# Patient Record
Sex: Female | Born: 1955 | Race: White | Hispanic: No | Marital: Married | Smoking: Never smoker
Health system: Southern US, Community
[De-identification: ages and names within clinical notes are randomized; demographics above are authoritative.]

## PROBLEM LIST (undated history)

## (undated) DIAGNOSIS — M1811 Unilateral primary osteoarthritis of first carpometacarpal joint, right hand: Secondary | ICD-10-CM

## (undated) DIAGNOSIS — J45909 Unspecified asthma, uncomplicated: Secondary | ICD-10-CM

## (undated) DIAGNOSIS — F419 Anxiety disorder, unspecified: Secondary | ICD-10-CM

## (undated) DIAGNOSIS — Z86010 Personal history of colonic polyps: Secondary | ICD-10-CM

## (undated) DIAGNOSIS — F32A Depression, unspecified: Secondary | ICD-10-CM

## (undated) DIAGNOSIS — M1990 Unspecified osteoarthritis, unspecified site: Secondary | ICD-10-CM

## (undated) DIAGNOSIS — I7300 Raynaud's syndrome without gangrene: Secondary | ICD-10-CM

## (undated) DIAGNOSIS — S5291XA Unspecified fracture of right forearm, initial encounter for closed fracture: Secondary | ICD-10-CM

## (undated) DIAGNOSIS — M810 Age-related osteoporosis without current pathological fracture: Secondary | ICD-10-CM

## (undated) HISTORY — DX: Age-related osteoporosis without current pathological fracture: M81.0

## (undated) HISTORY — DX: Unspecified asthma, uncomplicated: J45.909

## (undated) HISTORY — PX: WRIST FRACTURE SURGERY: SHX121

## (undated) HISTORY — DX: Unspecified osteoarthritis, unspecified site: M19.90

## (undated) HISTORY — PX: BLADDER SURGERY: SHX569

## (undated) HISTORY — PX: BLADDER SUSPENSION: SHX72

## (undated) HISTORY — DX: Personal history of colonic polyps: Z86.010

---

## 1978-06-10 HISTORY — PX: TUBAL LIGATION: SHX77

## 1983-06-11 HISTORY — PX: EYE SURGERY: SHX253

## 1997-11-28 ENCOUNTER — Inpatient Hospital Stay (HOSPITAL_COMMUNITY): Admission: RE | Admit: 1997-11-28 | Discharge: 1997-11-30 | Payer: Self-pay | Admitting: Gynecology

## 1998-03-13 ENCOUNTER — Other Ambulatory Visit: Admission: RE | Admit: 1998-03-13 | Discharge: 1998-03-13 | Payer: Self-pay | Admitting: Gynecology

## 2000-01-30 ENCOUNTER — Other Ambulatory Visit: Admission: RE | Admit: 2000-01-30 | Discharge: 2000-01-30 | Payer: Self-pay | Admitting: Gynecology

## 2001-02-10 ENCOUNTER — Other Ambulatory Visit: Admission: RE | Admit: 2001-02-10 | Discharge: 2001-02-10 | Payer: Self-pay | Admitting: Gynecology

## 2002-01-08 ENCOUNTER — Other Ambulatory Visit: Admission: RE | Admit: 2002-01-08 | Discharge: 2002-01-08 | Payer: Self-pay | Admitting: Gynecology

## 2003-02-03 ENCOUNTER — Other Ambulatory Visit: Admission: RE | Admit: 2003-02-03 | Discharge: 2003-02-03 | Payer: Self-pay | Admitting: Gynecology

## 2003-11-01 ENCOUNTER — Ambulatory Visit (HOSPITAL_COMMUNITY): Admission: RE | Admit: 2003-11-01 | Discharge: 2003-11-01 | Payer: Self-pay | Admitting: Vascular Surgery

## 2004-04-09 ENCOUNTER — Other Ambulatory Visit: Admission: RE | Admit: 2004-04-09 | Discharge: 2004-04-09 | Payer: Self-pay | Admitting: Gynecology

## 2005-04-12 ENCOUNTER — Other Ambulatory Visit: Admission: RE | Admit: 2005-04-12 | Discharge: 2005-04-12 | Payer: Self-pay | Admitting: Gynecology

## 2006-04-14 ENCOUNTER — Other Ambulatory Visit: Admission: RE | Admit: 2006-04-14 | Discharge: 2006-04-14 | Payer: Self-pay | Admitting: Gynecology

## 2006-06-10 DIAGNOSIS — Z860100 Personal history of colon polyps, unspecified: Secondary | ICD-10-CM

## 2006-06-10 HISTORY — DX: Personal history of colon polyps, unspecified: Z86.0100

## 2006-06-10 HISTORY — DX: Personal history of colonic polyps: Z86.010

## 2007-05-04 ENCOUNTER — Other Ambulatory Visit: Admission: RE | Admit: 2007-05-04 | Discharge: 2007-05-04 | Payer: Self-pay | Admitting: Gynecology

## 2008-05-09 ENCOUNTER — Encounter: Payer: Self-pay | Admitting: Women's Health

## 2008-05-09 ENCOUNTER — Other Ambulatory Visit: Admission: RE | Admit: 2008-05-09 | Discharge: 2008-05-09 | Payer: Self-pay | Admitting: Gynecology

## 2008-05-09 ENCOUNTER — Ambulatory Visit: Payer: Self-pay | Admitting: Women's Health

## 2008-05-10 ENCOUNTER — Ambulatory Visit: Payer: Self-pay | Admitting: Women's Health

## 2008-06-13 ENCOUNTER — Ambulatory Visit: Payer: Self-pay | Admitting: Women's Health

## 2008-07-07 ENCOUNTER — Ambulatory Visit: Payer: Self-pay | Admitting: Gynecology

## 2008-07-18 ENCOUNTER — Ambulatory Visit: Payer: Self-pay | Admitting: Gynecology

## 2008-08-02 ENCOUNTER — Ambulatory Visit: Payer: Self-pay | Admitting: Gynecology

## 2008-08-04 ENCOUNTER — Ambulatory Visit: Payer: Self-pay | Admitting: Gynecology

## 2008-08-04 ENCOUNTER — Encounter: Payer: Self-pay | Admitting: Gynecology

## 2009-05-12 ENCOUNTER — Other Ambulatory Visit: Admission: RE | Admit: 2009-05-12 | Discharge: 2009-05-12 | Payer: Self-pay | Admitting: Gynecology

## 2009-05-12 ENCOUNTER — Ambulatory Visit: Payer: Self-pay | Admitting: Women's Health

## 2010-06-20 ENCOUNTER — Ambulatory Visit
Admission: RE | Admit: 2010-06-20 | Discharge: 2010-06-20 | Payer: Self-pay | Source: Home / Self Care | Attending: Women's Health | Admitting: Women's Health

## 2010-06-20 ENCOUNTER — Other Ambulatory Visit
Admission: RE | Admit: 2010-06-20 | Discharge: 2010-06-20 | Payer: Self-pay | Source: Home / Self Care | Admitting: Gynecology

## 2010-07-17 ENCOUNTER — Encounter (INDEPENDENT_AMBULATORY_CARE_PROVIDER_SITE_OTHER): Payer: Private Health Insurance - Indemnity

## 2010-07-17 DIAGNOSIS — M949 Disorder of cartilage, unspecified: Secondary | ICD-10-CM

## 2010-07-17 DIAGNOSIS — M899 Disorder of bone, unspecified: Secondary | ICD-10-CM

## 2010-10-26 NOTE — Op Note (Signed)
NAMESKYLENE, DEREMER                          ACCOUNT NO.:  [id number]   MEDICAL RECORD NO.:  [id number]                   PATIENT TYPE:  OIB   LOCATION:  2899                                 FACILITY:  [facility]   PHYSICIAN:  Janetta Hora. Fields, MD               DATE OF BIRTH:  1955/06/17   DATE OF PROCEDURE:  11/01/2003  DATE OF DISCHARGE:                                 OPERATIVE REPORT   PROCEDURE:  Arch aortogram with left upper extremity angiogram and third  order catheterization of the brachial artery.   PREOPERATIVE DIAGNOSIS:  Ischemia, left hand.   POSTOPERATIVE DIAGNOSIS:  Ischemia, left hand.   ANESTHESIA:  Local with IV sedation.   ASSISTANT:  Nurse.   INDICATIONS FOR PROCEDURE:  The patient is a 55 year old female who has had  a two week history of numbness, tingling, and discoloration of her second  and third digits on her left hand.   OPERATIVE PROCEDURE:  After obtaining informed consent, the patient was  brought to the angiogram suite.  The patient was placed in the supine  position on the angiogram table.  The right groin was prepped and draped in  the usual sterile fashion.  Local anesthesia was infiltrated over the right  common femoral artery.  A Majestic needle was used to cannulate the right  common femoral artery and a 0.035 Wooley wire threaded into the lower  abdominal aorta under fluoroscopic guidance.  A 5 French sheath was placed  over the guide-wire.  Next, a 5 French pigtail catheter was threaded over  the [facility] wire and, as a unit, these were advanced into the aortic arch.  An arch aortogram was then performed which shows normal take off of the  innominate artery with normal right subclavian and right common carotid  origins.  The right vertebral artery fills normally as well as the right  internal mammary artery.  The left common carotid artery comes off normal  position off the arch.  The left subclavian artery comes off normal position  off the  arch.  Both of these vessels were widely patent in their origin.  The left vertebral artery was widely patent without significant stenosis.  The left internal mammary artery comes off normal position off the  subclavian artery.  The left subclavian system is slightly smaller in size  than the right subclavian system.  Next, images were performed of the  proximal subclavian artery.  This is normal in appearance without any  irregularity, ulceration, or plaque.  Proceeding down the arm, the axillary  artery is normal in appearance with no ulceration or plaque.  The proximal  brachial artery is also normal in appearance.  The brachial bifurcation is  normal in appearance with normal origins of the interosseous, radial, and  ulnar arteries.  To increase the flow of contrast down the arm, the left  subclavian artery was selectively cannulated with the  Wooley wire using an  H1 catheter.  The H1 catheter was initially advanced into the proximal  subclavian artery and there was still inadequate contrast flow down to the  hand, so the Wooley wire was removed and a J-tip 0.035 guide-wire advanced  into the distal subclavian artery and the catheter advanced over this and  the catheter and wire advanced into the distal brachial artery.  The  superficial and deep palmar arches fill completely.  The radial and ulnar  arteries are normal throughout their course all the way to the hand.  The  proximal digital arteries of all five digits fill normally, however, there  is severe spasm from the proximal interphalangeal joint all the way out to  the distal digits.  Several attempts were made to release the spasm  including two doses of 200 mg nitroglycerin and then two doses of 30 mg of  papaverine.  These were both injected intra-arterially through the H1  catheter.  Despite these several different vasodilator drugs, the spasm was  still quite severe from the PIP joint distally.  The first digit did fill  all  the way out to the distal phalanx, however, the PIP joint was the only  level that filled through digits 2 through 4.  The patient was also noted to  have some flushing and hyperemia in the left arm during administration of  these vasodilator drugs, but she never had a hyperemic response in her hand.  At this point, the patient had been given a significant amount of contrast  and her blood pressure would no longer tolerate administration of anymore  vasoactive drugs.  The patient was briefly placed in Trendelenburg position  and given a fluid bolus to bring her blood pressure from the mid 70s back up  into the low 100\s which is her baseline.  Next, the guide-wire and H1  catheter were removed as a unit.  The 5 French sheath was removed and  hemostasis obtained with direct pressure.  The patient tolerated the  procedure well and there were no complications.   IMPRESSION:  1. Severe vasospasm of distal digital arteries left hand.  2. Normal subclavian, axillary, brachial, radial, interosseous, ulnar and     palmar arch vessels.                                               Janetta Hora. Fields, MD    CEF/MEDQ  D:  11/01/2003  T:  11/01/2003  Job:  [id number]

## 2011-05-28 ENCOUNTER — Encounter: Payer: Self-pay | Admitting: Women's Health

## 2011-06-22 ENCOUNTER — Other Ambulatory Visit: Payer: Self-pay | Admitting: Women's Health

## 2011-11-18 ENCOUNTER — Encounter: Payer: Self-pay | Admitting: Women's Health

## 2011-11-18 ENCOUNTER — Ambulatory Visit (INDEPENDENT_AMBULATORY_CARE_PROVIDER_SITE_OTHER): Payer: 59 | Admitting: Women's Health

## 2011-11-18 VITALS — BP 114/70 | Ht 63.0 in | Wt 161.0 lb

## 2011-11-18 DIAGNOSIS — M1990 Unspecified osteoarthritis, unspecified site: Secondary | ICD-10-CM | POA: Insufficient documentation

## 2011-11-18 DIAGNOSIS — Z13220 Encounter for screening for lipoid disorders: Secondary | ICD-10-CM

## 2011-11-18 DIAGNOSIS — Z01419 Encounter for gynecological examination (general) (routine) without abnormal findings: Secondary | ICD-10-CM

## 2011-11-18 DIAGNOSIS — Z833 Family history of diabetes mellitus: Secondary | ICD-10-CM

## 2011-11-18 LAB — CBC WITH DIFFERENTIAL/PLATELET
Eosinophils Absolute: 0.7 10*3/uL (ref 0.0–0.7)
HCT: 43.3 % (ref 36.0–46.0)
Hemoglobin: 13.6 g/dL (ref 12.0–15.0)
Lymphocytes Relative: 39 % (ref 12–46)
Monocytes Absolute: 0.6 10*3/uL (ref 0.1–1.0)
Monocytes Relative: 9 % (ref 3–12)
Platelets: 315 10*3/uL (ref 150–400)
RBC: 4.4 MIL/uL (ref 3.87–5.11)
WBC: 7.3 10*3/uL (ref 4.0–10.5)

## 2011-11-18 LAB — LIPID PANEL
Cholesterol: 293 mg/dL — ABNORMAL HIGH (ref 0–200)
Total CHOL/HDL Ratio: 5 Ratio

## 2011-11-18 NOTE — Progress Notes (Signed)
JANANI CHAMBER [birth year] [id number]    History:    The patient presents for annual exam.  Postmenopausal, no  HRT, no bleeding. Mammogram Jan. 2013, normal mammogram history. Last colonoscopy 2008-benign polyps. History of all normal Paps. Bone density 2012, osteopenia, T-score  -2.2 at femoral neck. History of a bladder neck suspension-1993 with good relief of symptoms.   Past medical history, past surgical history, family history and social history were all reviewed and documented in the EPIC chart. Mother hx of uterine CA, age 11. Retired from [employer]. History of anxiety/depression denies need for counseling or medication.  ROS:  A  ROS was performed and pertinent positives and negatives are included in the history.  Exam:  Filed Vitals:   11/18/11 1045  BP: 114/70    General appearance:  Normal Head/Neck:  Normal, without cervical or supraclavicular adenopathy. Thyroid:  Symmetrical, normal in size, without palpable masses or nodularity. Respiratory  Effort:  Normal  Auscultation:  Clear without wheezing or rhonchi Cardiovascular  Auscultation:  Regular rate, without rubs, murmurs or gallops  Edema/varicosities:  Not grossly evident Abdominal  Soft,nontender, without masses, guarding or rebound.  Liver/spleen:  No organomegaly noted  Hernia:  None appreciated  Skin  Inspection:  Grossly normal  Palpation:  Grossly normal Neurologic/psychiatric  Orientation:  Normal with appropriate conversation.  Mood/affect:  Normal  Genitourinary    Breasts: Examined lying and sitting.     Right: Without masses, retractions, discharge or axillary adenopathy.     Left: Without masses, retractions, discharge or axillary adenopathy.   Inguinal/mons:  Normal without inguinal adenopathy  External genitalia:  Normal  BUS/Urethra/Skene's glands:  Normal  Bladder:  Normal  Vagina:  Normal  Cervix:  Normal  Uterus:  Normal in size, shape and contour.  Midline and  mobile  Adnexa/parametria:     Rt: Without masses or tenderness.   Lt: Without masses or tenderness.  Anus and perineum: Normal  Digital rectal exam: Normal sphincter tone without palpated masses or tenderness  Assessment/Plan:  56 y.o. MWF G3P2 for annual exam.  Normal postmenopausal gyn exam/ no HRT/no bleeding Osteopenia 2012- T-score -2.2 at femoral neck. 2012 Colonoscopy 2008-benign polyps  Plan: CBC, Lipid panel, glucose, UA  Instructed to schedule colonoscopy. SBE's, vitamin D 2000 IU daily, Ca rich diet, daily exercise, yearly mammograms encouraged. Fall precautions reviewed. No pap, new screening recommendations reviewed.    Harrington Challenger [facility], 11:41 AM 11/18/2011

## 2011-11-18 NOTE — Patient Instructions (Signed)
Schedule colonoscopy Health Recommendations for Postmenopausal Women Based on the Results of the Women's Health Initiative (WHI) and Other Studies The WHI is a major 15-year research program to address the most common causes of death, disability and poor quality of life in postmenopausal women. Some of these causes are heart disease, cancer, bone loss (osteoporosis) and others. Taking into account all of the findings from WHI and other studies, here are bottom-line health recommendations for women: CARDIOVASCULAR DISEASE Heart Disease: A heart attack is a medical emergency. Know the signs and symptoms of a heart attack. Hormone therapy should not be used to prevent heart disease. In women with heart disease, hormone therapy should not be used to prevent further disease. Hormone therapy increases the risk of blood clots. Below are things women can do to reduce their risk for heart disease.   Do not smoke. If you smoke, quit. Women who smoke are 2 to 6 times more likely to suffer a heart attack than non-smoking women.   Aim for a healthy weight. Being overweight causes many preventable deaths. Eat a healthy and balanced diet and drink an adequate amount of liquids.   Get moving. Make a commitment to be more physically active. Aim for 30 minutes of activity on most, if not all days of the week.   Eat for heart health. Choose a diet that is low in saturated fat, trans fat, and cholesterol. Include whole grains, vegetables, and fruits. Read the labels on the food container before buying it.   Know your numbers. Ask your caregiver to check your blood pressure, cholesterol (total, HDL, LDL, triglycerides) and blood glucose. Work with your caregiver to improve any numbers that are not normal.   High blood pressure. Limit or stop your table salt intake (try salt substitute and food seasonings), avoid salty foods and drinks. Read the labels on the food container before buying it. Avoid becoming overweight by  eating well and exercising.  STROKE  Stroke is a medical emergency. Stroke can be the result of a blood clot in the blood vessel in the brain or by a brain hemorrhage (bleeding). Know the signs and symptoms of a stroke. To lower the risk of developing a stroke:  Avoid fatty foods.   Quit smoking.   Control your diabetes, blood pressure, and irregular heart rate.  THROMBOPHLIBITIS (BLOOD CLOT) OF THE LEG  Hormone treatment is a big cause of developing blood clots in the leg. Becoming overweight and leading a stationary lifestyle also may contribute to developing blood clots. Controlling your diet and exercising will help lower the risk of developing blood clots. CANCER SCREENING  Breast Cancer: Women should take steps to reduce their risk of breast cancer. This includes having regular mammograms, monthly self breast exams and regular breast exams by your caregiver. Have a mammogram every one to two years if you are 40 to 57 years old. Have a mammogram annually if you are 50 years old or older depending on your risk factors. Women who are high risk for breast cancer may need more frequent mammograms. There are tests available (testing the genes in your body) if you have family history of breast cancer called BRCA 1 and 2. These tests can help determine the risks of developing breast cancer.   Intestinal or Stomach Cancer: Women should talk to their caregiver about when to start screening, what tests and how often they should be done, and the benefits and risks of doing these tests. Tests to consider are a rectal   exam, fecal occult blood, sigmoidoscopy, colononoscoby, barium enema and upper GI series of the stomach. Depending on the age, you may want to get a medical and family history of colon cancer. Women who are high risk may need to be screened at an earlier age and more often.   Cervical Cancer: A Pap test of the cervix should be done every year and every 3 years when there has been three  straight years of a normal Pap test. Women with an abnormal Pap test should be screened more often or have a cervical biopsy depending on your caregiver's recommendation.   Uterine Cancer: If you have vaginal bleeding after you are in the menopause, it should be evaluated by your caregiver.   Ovarian cancer: There are no reliable tests available to screen for ovarian cancer at this time except for yearly pelvic exams.   Lung Cancer: Yearly chest X-rays can detect lung cancer and should be done on high risk women, such as cigarette smokers and women with chronic lung disease (emphysemia).   Skin Cancer: A complete body skin exam should be done at your yearly examination. Avoid overexposure to the sun and ultraviolet light lamps. Use a strong sun block cream when in the sun. All of these things are important in lowering the risk of skin cancer.  MENOPAUSE Menopause Symptoms: Hormone therapy products are effective for treating symptoms associated with menopause:  Moderate to severe hot flashes.   Night sweats.   Mood swings.   Headaches.   Tiredness.   Loss of sex drive.   Insomnia.   Other symptoms.  However, hormone therapy products carry serious risks, especially in older women. Women who use or are thinking about using estrogen or estrogen with progestin treatments should discuss that with their caregiver. Your caregiver will know if the benefits outweigh the risks. The Food and Drug Administration (FDA) has concluded that hormone therapy should be used only at the lowest doses and for the shortest amount of time to reach treatment goals. It is not known at what doses there may be less risk of serious side effects. There are other treatments such as herbal medication (not controlled or regulated by the FDA), group therapy, counseling and acupuncture that may be helpful. OSTEOPOROSIS Protecting Against Bone Loss and Preventing Fracture: If hormone therapy is used for prevention of bone  loss (osteoporosis), the risks for bone loss must outweigh the risk of the therapy. Women considering taking hormone therapy for bone loss should ask their health care providers about other medications (fosamax and boniva) that are considered safe and effective for preventing bone loss and bone fractures. To guard against bone loss or fractures, it is recommended that women should take at least 1000-1500 mg of calcium and 400-800 IU of vitamin D daily in divided doses. Smoking and excessive alcohol intake increases the risk of osteoporosis. Eat foods rich in calcium and vitamin D and do weight bearing exercises several times a week as your caregiver suggests. DIABETES Diabetes Melitus: Women with Type I or Type 2 diabetes should keep their diabetes in control with diet, exercise and medication. Avoid too many sweets, starchy and fatty foods. Being overweight can affect your diabetes. COGNITION AND MEMORY Cognition and Memory: Menopausal hormone therapy is not recommended for the prevention of cognitive disorders such as Alzheimer's disease or memory loss. WHI found that women treated with hormone therapy have a greater risk of developing dementia.  DEPRESSION  Depression may occur at any age, but is common in   elderly women. The reasons may be because of physical, medical, social (loneliness), financial and/or economic problems and needs. Becoming involved with church, volunteer or social groups, seeking treatment for any physical or medical problems is recommended. Also, look into getting professional advice for any economic or financial problems. ACCIDENTS  Accidents are common and can be serious in the elderly woman. Prepare your house to prevent accidents. Eliminate throw rugs, use hip protectors, place hand bars in the bath, shower and toilet areas. Avoid wearing high heel shoes and walking on wet, snowy and icy areas. Stop driving if you have vision, hearing problems or are unsteady with you movements  and reflexes. RHEUMATOID ARTHRITIS Rheumatoid arthritis causes pain, swelling and stiffness of your bone joints. It can limit many of your activities. Over-the-counter medications may help, but prescription medications may be necessary. Talk with your caregiver about this. Exercise (walking, water aerobics), good posture, using splints on painful joints, warm baths or applying warm compresses to stiff joints and cold compresses to painful joints may be helpful. Smoking and excessive drinking may worsen the symptoms of arthritis. Seek help from a physical therapist if the arthritis is becoming a problem with your daily activities. IMMUNIZATIONS  Several immunizations are important to have during your senior years, including:   Tetanus and a diptheria shot booster every 10 years.   Influenza every year before the flu season begins.   Pneumonia vaccine.   Shingles vaccine.   Others as indicated (example: H1N1 vaccine).  Document Released: 07/19/2005 Document Revised: 05/16/2011 Document Reviewed: 03/14/2008 ExitCare Patient Information 2012 [employer]. 

## 2011-11-19 LAB — URINALYSIS W MICROSCOPIC + REFLEX CULTURE
Bacteria, UA: NONE SEEN
Bilirubin Urine: NEGATIVE
Casts: NONE SEEN
Glucose, UA: NEGATIVE mg/dL
Hgb urine dipstick: NEGATIVE
Ketones, ur: NEGATIVE mg/dL
Protein, ur: NEGATIVE mg/dL
Urobilinogen, UA: 0.2 mg/dL (ref 0.0–1.0)

## 2012-01-06 ENCOUNTER — Telehealth: Payer: Self-pay | Admitting: *Deleted

## 2012-01-06 MED ORDER — FLUOXETINE HCL 40 MG PO CAPS: 40.0000 mg | ORAL_CAPSULE | Freq: Every day | ORAL | Status: DC

## 2012-01-06 NOTE — Telephone Encounter (Signed)
Okay for 90 day supply per nancy. rx sent.

## 2012-01-06 NOTE — Telephone Encounter (Signed)
Pt calling requesting Rx for her Prozac 40 mg tablet, she is requesting 90 day supply to [facility]. Okay to fill? Giving back in Jan 2012.  Please advise

## 2012-05-26 ENCOUNTER — Encounter: Payer: Self-pay | Admitting: Women's Health

## 2012-06-16 ENCOUNTER — Other Ambulatory Visit: Payer: Self-pay | Admitting: Women's Health

## 2012-10-07 ENCOUNTER — Telehealth: Payer: Self-pay | Admitting: *Deleted

## 2012-10-07 ENCOUNTER — Ambulatory Visit (INDEPENDENT_AMBULATORY_CARE_PROVIDER_SITE_OTHER): Payer: Managed Care, Other (non HMO) | Admitting: Women's Health

## 2012-10-07 ENCOUNTER — Encounter: Payer: Self-pay | Admitting: Women's Health

## 2012-10-07 VITALS — Wt 136.0 lb

## 2012-10-07 DIAGNOSIS — B373 Candidiasis of vulva and vagina: Secondary | ICD-10-CM

## 2012-10-07 DIAGNOSIS — N898 Other specified noninflammatory disorders of vagina: Secondary | ICD-10-CM

## 2012-10-07 DIAGNOSIS — N899 Noninflammatory disorder of vagina, unspecified: Secondary | ICD-10-CM

## 2012-10-07 LAB — WET PREP FOR TRICH, YEAST, CLUE
Clue Cells Wet Prep HPF POC: NONE SEEN
Yeast Wet Prep HPF POC: NONE SEEN

## 2012-10-07 MED ORDER — FLUCONAZOLE 150 MG PO TABS: 150.0000 mg | ORAL_TABLET | Freq: Once | ORAL | Status: DC

## 2012-10-07 NOTE — Telephone Encounter (Signed)
Diflucan 150 was not sent to correct pharmacy, rx sent to mail order. Pt would like rx sent to [facility] [address].

## 2012-10-07 NOTE — Addendum Note (Signed)
Addended by: Richardson Chiquito on: 10/07/2012 02:29 PM   Modules accepted: Orders

## 2012-10-07 NOTE — Patient Instructions (Addendum)
Monilial Vaginitis Vaginitis in a soreness, swelling and redness (inflammation) of the vagina and vulva. Monilial vaginitis is not a sexually transmitted infection. CAUSES  Yeast vaginitis is caused by yeast (candida) that is normally found in your vagina. With a yeast infection, the candida has overgrown in number to a point that upsets the chemical balance. SYMPTOMS   White, thick vaginal discharge.  Swelling, itching, redness and irritation of the vagina and possibly the lips of the vagina (vulva).  Burning or painful urination.  Painful intercourse. DIAGNOSIS  Things that may contribute to monilial vaginitis are:  Postmenopausal and virginal states.  Pregnancy.  Infections.  Being tired, sick or stressed, especially if you had monilial vaginitis in the past.  Diabetes. Good control will help lower the chance.  Birth control pills.  Tight fitting garments.  Using bubble bath, feminine sprays, douches or deodorant tampons.  Taking certain medications that kill germs (antibiotics).  Sporadic recurrence can occur if you become ill. TREATMENT  Your caregiver will give you medication.  There are several kinds of anti monilial vaginal creams and suppositories specific for monilial vaginitis. For recurrent yeast infections, use a suppository or cream in the vagina 2 times a week, or as directed.  Anti-monilial or steroid cream for the itching or irritation of the vulva may also be used. Get your caregiver's permission.  Painting the vagina with methylene blue solution may help if the monilial cream does not work.  Eating yogurt may help prevent monilial vaginitis. HOME CARE INSTRUCTIONS   Finish all medication as prescribed.  Do not have sex until treatment is completed or after your caregiver tells you it is okay.  Take warm sitz baths.  Do not douche.  Do not use tampons, especially scented ones.  Wear cotton underwear.  Avoid tight pants and panty  hose.  Tell your sexual partner that you have a yeast infection. They should go to their caregiver if they have symptoms such as mild rash or itching.  Your sexual partner should be treated as well if your infection is difficult to eliminate.  Practice safer sex. Use condoms.  Some vaginal medications cause latex condoms to fail. Vaginal medications that harm condoms are:  Cleocin cream.  Butoconazole (Femstat).  Terconazole (Terazol) vaginal suppository.  Miconazole (Monistat) (may be purchased over the counter). SEEK MEDICAL CARE IF:   You have a temperature by mouth above 102 F (38.9 C).  The infection is getting worse after 2 days of treatment.  The infection is not getting better after 3 days of treatment.  You develop blisters in or around your vagina.  You develop vaginal bleeding, and it is not your menstrual period.  You have pain when you urinate.  You develop intestinal problems.  You have pain with sexual intercourse. Document Released: 03/06/2005 Document Revised: 08/19/2011 Document Reviewed: 11/18/2008 ExitCare Patient Information 2013 [employer].  

## 2012-10-07 NOTE — Progress Notes (Signed)
Patient ID: Erika Davis, female   DOB: [birth year], 57 y.o.   MRN: [id number] Presents with complaint of vaginal irritation with dyspareunia for 2 months. Postmenopausal with no bleeding/no HRT. Symptoms started after doing hot yoga for several sessions. Denies discharge with odor, urinary symptoms, abdominal pain, fever. Has lost 25 pounds with [employer].  Exam: Appears well, external genitalia extremely erythematous, wet prep done with a Q-tip. Wet prep negative.  Clinical Yeast vaginitis  Plan: Diflucan 150 mg  today repeat in 3 days if needed. Call if no relief of symptoms. Continue vaginal lubricants as needed with intercourse.

## 2012-10-16 ENCOUNTER — Encounter: Payer: Self-pay | Admitting: Women's Health

## 2012-10-16 ENCOUNTER — Ambulatory Visit (INDEPENDENT_AMBULATORY_CARE_PROVIDER_SITE_OTHER): Payer: Managed Care, Other (non HMO) | Admitting: Women's Health

## 2012-10-16 DIAGNOSIS — N898 Other specified noninflammatory disorders of vagina: Secondary | ICD-10-CM

## 2012-10-16 DIAGNOSIS — L293 Anogenital pruritus, unspecified: Secondary | ICD-10-CM

## 2012-10-16 LAB — WET PREP FOR TRICH, YEAST, CLUE
Clue Cells Wet Prep HPF POC: NONE SEEN
Trich, Wet Prep: NONE SEEN
Yeast Wet Prep HPF POC: NONE SEEN

## 2012-10-16 MED ORDER — NYSTATIN-TRIAMCINOLONE 100000-0.1 UNIT/GM-% EX OINT: TOPICAL_OINTMENT | Freq: Two times a day (BID) | CUTANEOUS | Status: DC

## 2012-10-16 NOTE — Progress Notes (Signed)
Patient ID: Erika Davis, female   DOB: [birth year], 57 y.o.   MRN: [id number] Presents for follow up for vaginal erythema with itching/clinical yeast infection on 10/07/12 after hot yoga classes. Has taken 2 doses of Diflucan 3 days apart, 75% better  but symptoms persist. Swelling and itching decreased but still tender and itchy.Denies discharge with odor, urinary symptoms, abdominal pain, fever.   Exam: appears well. External genitalia minimal erythema noted, improved, Wet prep-negative.  Improved yeast vaginitis  Plan: Mycolog apply topically two times daily. Instructed to continue vaginal lubricant with intercourse and call if symptoms don't resolve.

## 2012-11-18 ENCOUNTER — Other Ambulatory Visit (HOSPITAL_COMMUNITY)
Admission: RE | Admit: 2012-11-18 | Discharge: 2012-11-18 | Disposition: A | Discharge status: Home / Self Care | Payer: 59 | Source: Ambulatory Visit | Attending: Gynecology | Admitting: Gynecology

## 2012-11-18 ENCOUNTER — Encounter: Payer: Self-pay | Admitting: Women's Health

## 2012-11-18 ENCOUNTER — Ambulatory Visit (INDEPENDENT_AMBULATORY_CARE_PROVIDER_SITE_OTHER): Payer: Managed Care, Other (non HMO) | Admitting: Women's Health

## 2012-11-18 VITALS — BP 114/74 | Ht 63.0 in | Wt 132.0 lb

## 2012-11-18 DIAGNOSIS — Z23 Encounter for immunization: Secondary | ICD-10-CM

## 2012-11-18 DIAGNOSIS — Z01419 Encounter for gynecological examination (general) (routine) without abnormal findings: Secondary | ICD-10-CM

## 2012-11-18 DIAGNOSIS — N898 Other specified noninflammatory disorders of vagina: Secondary | ICD-10-CM

## 2012-11-18 DIAGNOSIS — N9489 Other specified conditions associated with female genital organs and menstrual cycle: Secondary | ICD-10-CM

## 2012-11-18 MED ORDER — ESTRADIOL 10 MCG VA TABS: 1.0000 | ORAL_TABLET | VAGINAL | Status: DC

## 2012-11-18 NOTE — Patient Instructions (Addendum)
Health Recommendations for Postmenopausal Women Respected and ongoing research has looked at the most common causes of death, disability, and poor quality of life in postmenopausal women. The causes include heart disease, diseases of blood vessels, diabetes, depression, cancer, and bone loss (osteoporosis). Many things can be done to help lower the chances of developing these and other common problems: CARDIOVASCULAR DISEASE Heart Disease: A heart attack is a medical emergency. Know the signs and symptoms of a heart attack. Below are things women can do to reduce their risk for heart disease.   Do not smoke. If you smoke, quit.  Aim for a healthy weight. Being overweight causes many preventable deaths. Eat a healthy and balanced diet and drink an adequate amount of liquids.  Get moving. Make a commitment to be more physically active. Aim for 30 minutes of activity on most, if not all days of the week.  Eat for heart health. Choose a diet that is low in saturated fat and cholesterol and eliminate trans fat. Include whole grains, vegetables, and fruits. Read and understand the labels on food containers before buying.  Know your numbers. Ask your caregiver to check your blood pressure, cholesterol (total, HDL, LDL, triglycerides) and blood glucose. Work with your caregiver on improving your entire clinical picture.  High blood pressure. Limit or stop your table salt intake (try salt substitute and food seasonings). Avoid salty foods and drinks. Read labels on food containers before buying. Eating well and exercising can help control high blood pressure. STROKE  Stroke is a medical emergency. Stroke may be the result of a blood clot in a blood vessel in the brain or by a brain hemorrhage (bleeding). Know the signs and symptoms of a stroke. To lower the risk of developing a stroke:  Avoid fatty foods.  Quit smoking.  Control your diabetes, blood pressure, and irregular heart rate. THROMBOPHLEBITIS  (BLOOD CLOT) OF THE LEG  Becoming overweight and leading a stationary lifestyle may also contribute to developing blood clots. Controlling your diet and exercising will help lower the risk of developing blood clots. CANCER SCREENING  Breast Cancer: Take steps to reduce your risk of breast cancer.  You should practice "breast self-awareness." This means understanding the normal appearance and feel of your breasts and should include breast self-examination. Any changes detected, no matter how small, should be reported to your caregiver.  After age 40, you should have a clinical breast exam (CBE) every year.  Starting at age 40, you should consider having a mammogram (breast X-ray) every year.  If you have a family history of breast cancer, talk to your caregiver about genetic screening.  If you are at high risk for breast cancer, talk to your caregiver about having an MRI and a mammogram every year.  Intestinal or Stomach Cancer: Tests to consider are a rectal exam, fecal occult blood, sigmoidoscopy, and colonoscopy. Women who are high risk may need to be screened at an earlier age and more often.  Cervical Cancer:  Beginning at age 30, you should have a Pap test every 3 years as long as the past 3 Pap tests have been normal.  If you have had past treatment for cervical cancer or a condition that could lead to cancer, you need Pap tests and screening for cancer for at least 20 years after your treatment.  If you had a hysterectomy for a problem that was not cancer or a condition that could lead to cancer, then you no longer need Pap tests.    If you are between ages 65 and 70, and you have had normal Pap tests going back 10 years, you no longer need Pap tests.  If Pap tests have been discontinued, risk factors (such as a new sexual partner) need to be reassessed to determine if screening should be resumed.  Some medical problems can increase the chance of getting cervical cancer. In these  cases, your caregiver may recommend more frequent screening and Pap tests.  Uterine Cancer: If you have vaginal bleeding after reaching menopause, you should notify your caregiver.  Ovarian cancer: Other than yearly pelvic exams, there are no reliable tests available to screen for ovarian cancer at this time except for yearly pelvic exams.  Lung Cancer: Yearly chest X-rays can detect lung cancer and should be done on high risk women, such as cigarette smokers and women with chronic lung disease (emphysema).  Skin Cancer: A complete body skin exam should be done at your yearly examination. Avoid overexposure to the sun and ultraviolet light lamps. Use a strong sun block cream when in the sun. All of these things are important in lowering the risk of skin cancer. MENOPAUSE Menopause Symptoms: Hormone therapy products are effective for treating symptoms associated with menopause:  Moderate to severe hot flashes.  Night sweats.  Mood swings.  Headaches.  Tiredness.  Loss of sex drive.  Insomnia.  Other symptoms. Hormone replacement carries certain risks, especially in older women. Women who use or are thinking about using estrogen or estrogen with progestin treatments should discuss that with their caregiver. Your caregiver will help you understand the benefits and risks. The ideal dose of hormone replacement therapy is not known. The Food and Drug Administration (FDA) has concluded that hormone therapy should be used only at the lowest doses and for the shortest amount of time to reach treatment goals.  OSTEOPOROSIS Protecting Against Bone Loss and Preventing Fracture: If you use hormone therapy for prevention of bone loss (osteoporosis), the risks for bone loss must outweigh the risk of the therapy. Ask your caregiver about other medications known to be safe and effective for preventing bone loss and fractures. To guard against bone loss or fractures, the following is recommended:  If  you are less than age 50, take 1000 mg of calcium and at least 600 mg of Vitamin D per day.  If you are greater than age 50 but less than age 70, take 1200 mg of calcium and at least 600 mg of Vitamin D per day.  If you are greater than age 70, take 1200 mg of calcium and at least 800 mg of Vitamin D per day. Smoking and excessive alcohol intake increases the risk of osteoporosis. Eat foods rich in calcium and vitamin D and do weight bearing exercises several times a week as your caregiver suggests. DIABETES Diabetes Melitus: If you have Type I or Type 2 diabetes, you should keep your blood sugar under control with diet, exercise and recommended medication. Avoid too many sweets, starchy and fatty foods. Being overweight can make control more difficult. COGNITION AND MEMORY Cognition and Memory: Menopausal hormone therapy is not recommended for the prevention of cognitive disorders such as Alzheimer's disease or memory loss.  DEPRESSION  Depression may occur at any age, but is common in elderly women. The reasons may be because of physical, medical, social (loneliness), or financial problems and needs. If you are experiencing depression because of medical problems and control of symptoms, talk to your caregiver about this. Physical activity and   exercise may help with mood and sleep. Community and volunteer involvement may help your sense of value and worth. If you have depression and you feel that the problem is getting worse or becoming severe, talk to your caregiver about treatment options that are best for you. ACCIDENTS  Accidents are common and can be serious in the elderly woman. Prepare your house to prevent accidents. Eliminate throw rugs, place hand bars in the bath, shower and toilet areas. Avoid wearing high heeled shoes or walking on wet, snowy, and icy areas. Limit or stop driving if you have vision or hearing problems, or you feel you are unsteady with you movements and  reflexes. HEPATITIS C Hepatitis C is a type of viral infection affecting the liver. It is spread mainly through contact with blood from an infected person. It can be treated, but if left untreated, it can lead to severe liver damage over years. Many people who are infected do not know that the virus is in their blood. If you are a "baby-boomer", it is recommended that you have one screening test for Hepatitis C. IMMUNIZATIONS  Several immunizations are important to consider having during your senior years, including:   Tetanus, diptheria, and pertussis booster shot.  Influenza every year before the flu season begins.  Pneumonia vaccine.  Shingles vaccine.  Others as indicated based on your specific needs. Talk to your caregiver about these. Document Released: 07/19/2005 Document Revised: 05/13/2012 Document Reviewed: 03/14/2008 Exit[employer] Patient Information 2014 [employer].  

## 2012-11-18 NOTE — Progress Notes (Signed)
ODALY PERI Oct 07, 1955 [id number]    History:    The patient presents for annual exam.  Postmenopausal with no bleeding and no HRT/BTL. History of normal Paps and mammograms. Benign colon polyp 2008. Has done annual home Hemoccult cards/ negative/was instructed to have repeat colonoscopy at 10 years. Labs normal with primary care/Dr. Clarene Duke. Osteopenia T score -2.2 it left femoral neck bilateral hip average -1.4 nonsignificant change from 2010-2012. FRAX 8.3%/1.2%.   Past medical history, past surgical history, family history and social history were all reviewed and documented in the EPIC chart. Retired from [employer]. Has lost 30 pounds in the past year with diet and exercise. Daughter Judeth Cornfield and son Susy Frizzle both doing well.   ROS:  A  ROS was performed and pertinent positives and negatives are included in the history.  Exam:  Filed Vitals:   11/18/12 1051  BP: 114/74    General appearance:  Normal Head/Neck:  Normal, without cervical or supraclavicular adenopathy. Thyroid:  Symmetrical, normal in size, without palpable masses or nodularity. Respiratory  Effort:  Normal  Auscultation:  Clear without wheezing or rhonchi Cardiovascular  Auscultation:  Regular rate, without rubs, murmurs or gallops  Edema/varicosities:  Not grossly evident Abdominal  Soft,nontender, without masses, guarding or rebound.  Liver/spleen:  No organomegaly noted  Hernia:  None appreciated  Skin  Inspection:  Grossly normal  Palpation:  Grossly normal Neurologic/psychiatric  Orientation:  Normal with appropriate conversation.  Mood/affect:  Normal  Genitourinary    Breasts: Examined lying and sitting.     Right: Without masses, retractions, discharge or axillary adenopathy.     Left: Without masses, retractions, discharge or axillary adenopathy.   Inguinal/mons:  Normal without inguinal adenopathy  External genitalia:  Normal  BUS/Urethra/Skene's glands:  Normal  Bladder:   Normal  Vagina:  atrophic  Cervix:  Normal  Uterus:   normal in size, shape and contour.  Midline and mobile  Adnexa/parametria:     Rt: Without masses or tenderness.   Lt: Without masses or tenderness.  Anus and perineum: Normal  Digital rectal exam: Normal sphincter tone without palpated masses or tenderness  Assessment/Plan:  57 y.o. M. WF G3 P2 for annual exam with complaint of vaginal dryness and dyspareunia.  Postmenopausal vaginal atrophy Osteopenia  Plan: SBE's, continue annual mammogram, calcium rich diet, vitamin D 2000 daily encouraged. Reviewed importance of exercise in relationship to bone health, continue regular pattern, home safety and fall prevention discussed. Repeat DEXA next year. Vagifem one applicator at bedtime for 2 weeks and then twice weekly and continue vaginal lubricants. Instructed to call if no relief of symptoms. UA, Pap, Pap normal 2012, new screening guidelines reviewed. Lonie Peak given    Harrington Challenger WHNP, 11:31 AM 11/18/2012

## 2012-11-18 NOTE — Addendum Note (Signed)
Addended by: Richardson Chiquito on: 11/18/2012 11:50 AM   Modules accepted: Orders

## 2012-11-18 NOTE — Addendum Note (Signed)
Addended by: Richardson Chiquito on: 11/18/2012 12:19 PM   Modules accepted: Orders

## 2012-11-19 LAB — URINALYSIS W MICROSCOPIC + REFLEX CULTURE
Bacteria, UA: NONE SEEN
Leukocytes, UA: NEGATIVE
Protein, ur: NEGATIVE mg/dL
Specific Gravity, Urine: 1.007 (ref 1.005–1.030)
Squamous Epithelial / LPF: NONE SEEN
Urobilinogen, UA: 0.2 mg/dL (ref 0.0–1.0)

## 2012-11-27 ENCOUNTER — Encounter: Payer: Self-pay | Admitting: Women's Health

## 2012-12-02 ENCOUNTER — Encounter: Payer: Self-pay | Admitting: Gynecology

## 2012-12-03 ENCOUNTER — Encounter: Payer: Self-pay | Admitting: Gynecology

## 2012-12-24 ENCOUNTER — Telehealth: Payer: Self-pay | Admitting: *Deleted

## 2012-12-24 DIAGNOSIS — N898 Other specified noninflammatory disorders of vagina: Secondary | ICD-10-CM

## 2012-12-24 MED ORDER — ESTRADIOL 10 MCG VA TABS: 1.0000 | ORAL_TABLET | VAGINAL | Status: DC

## 2012-12-24 NOTE — Telephone Encounter (Signed)
Pt never picked up rx for estradiol,still using the samples. She  would like to have [facility] send me a 90 day supply of the RX Estradiol 10 MCG Tabs vaginal tablet. rx sent pt had annual on 11/18/12.

## 2013-02-08 ENCOUNTER — Other Ambulatory Visit: Payer: Self-pay | Admitting: Women's Health

## 2013-02-08 DIAGNOSIS — N898 Other specified noninflammatory disorders of vagina: Secondary | ICD-10-CM

## 2013-02-09 MED ORDER — NYSTATIN-TRIAMCINOLONE 100000-0.1 UNIT/GM-% EX OINT: TOPICAL_OINTMENT | Freq: Two times a day (BID) | CUTANEOUS | Status: DC

## 2013-02-24 ENCOUNTER — Other Ambulatory Visit: Payer: Self-pay | Admitting: Women's Health

## 2013-04-15 ENCOUNTER — Other Ambulatory Visit: Payer: Self-pay

## 2013-05-27 ENCOUNTER — Encounter: Payer: Self-pay | Admitting: Women's Health

## 2013-10-12 ENCOUNTER — Other Ambulatory Visit: Payer: Self-pay | Admitting: Women's Health

## 2013-11-23 ENCOUNTER — Encounter: Payer: Self-pay | Admitting: Women's Health

## 2013-11-23 ENCOUNTER — Ambulatory Visit (INDEPENDENT_AMBULATORY_CARE_PROVIDER_SITE_OTHER): Payer: Managed Care, Other (non HMO) | Admitting: Women's Health

## 2013-11-23 VITALS — BP 119/70 | Ht 63.0 in | Wt 141.4 lb

## 2013-11-23 DIAGNOSIS — Z01419 Encounter for gynecological examination (general) (routine) without abnormal findings: Secondary | ICD-10-CM

## 2013-11-23 DIAGNOSIS — F341 Dysthymic disorder: Secondary | ICD-10-CM

## 2013-11-23 DIAGNOSIS — F411 Generalized anxiety disorder: Secondary | ICD-10-CM

## 2013-11-23 DIAGNOSIS — M810 Age-related osteoporosis without current pathological fracture: Secondary | ICD-10-CM | POA: Insufficient documentation

## 2013-11-23 DIAGNOSIS — M899 Disorder of bone, unspecified: Secondary | ICD-10-CM

## 2013-11-23 DIAGNOSIS — F419 Anxiety disorder, unspecified: Secondary | ICD-10-CM

## 2013-11-23 DIAGNOSIS — M949 Disorder of cartilage, unspecified: Secondary | ICD-10-CM

## 2013-11-23 DIAGNOSIS — M8580 Other specified disorders of bone density and structure, unspecified site: Secondary | ICD-10-CM

## 2013-11-23 DIAGNOSIS — F329 Major depressive disorder, single episode, unspecified: Secondary | ICD-10-CM

## 2013-11-23 MED ORDER — FLUOXETINE HCL 20 MG PO CAPS: 20.0000 mg | ORAL_CAPSULE | Freq: Every day | ORAL | Status: DC

## 2013-11-23 NOTE — Progress Notes (Signed)
Erika LenzLeesa A Davis 05/28/1956 [id number]    History:    Presents for annual exam. Postmenopausal/no bleeding/no HRT/BTL. Normal pap and mammogram history. DEXA 2012- Osteopenia T-score -2.2 L femoral neck, FRAX 8.3%/1.2%. Colonoscopy age 58 benign colon polyp, per gastroenterologist 10 year repeat.  Maintained 30 pound weight loss. Currently on Prozac 40 doing well but would like to decrease dosage. Used Vagifem in the past, only using Replens now.   Past medical history, past surgical history, family history and social history were all reviewed and documented in the EPIC chart. Retired [employer]. 3 new grandchildren. Husband recovering from prostate cancer doing well.  Mother-uterine cancer age 59.  ROS:  A  12 point ROS was performed and pertinent positives and negatives are included.  Exam:  Filed Vitals:   11/23/13 0841  BP: 119/70    General appearance:  Normal Thyroid:  Symmetrical, normal in size, without palpable masses or nodularity. Respiratory  Auscultation:  Clear without wheezing or rhonchi Cardiovascular  Auscultation:  Regular rate, without rubs, murmurs or gallops  Edema/varicosities:  Not grossly evident Abdominal  Soft,nontender, without masses, guarding or rebound.  Liver/spleen:  No organomegaly noted  Hernia:  None appreciated  Skin  Inspection:  Grossly normal   Breasts: Examined lying and sitting.     Right: Without masses, retractions, discharge or axillary adenopathy.     Left: Without masses, retractions, discharge or axillary adenopathy. Gentitourinary   Inguinal/mons:  Normal without inguinal adenopathy  External genitalia:  Normal  BUS/Urethra/Skene's glands:  Normal  Vagina:  Normal  Cervix:  Normal  Uterus:  anteverted, normal in size, shape and contour.  Midline and mobile  Adnexa/parametria:     Rt: Without masses or tenderness.   Lt: Without masses or tenderness.  Anus and perineum: Normal  Digital rectal exam: Normal sphincter tone  without palpated masses or tenderness  Assessment/Plan:  58 y.o. G3P2 for annual exam with no complaints.  Postmenopausal GYN exam/no HRT  Osteopenia Labs primary care Anxiety/depression  SBE's, continue annual mammograms, continue 3D, history of dense breasts. Pap 2014 normal, new screening guidelines reviewed. Weight bearing exercise, calcium rich diet and 2000 U vitamin D daily. Home safety and fall prevention discussed.  UA, vitamin D, will schedule DEXA. Prozac 20 mg by mouth daily prescription, proper use given and reviewed, declines need for counseling at this time. Instructed to call if problems with dosage change. Continue replens as needed for vaginal dryness.    Note: This dictation was prepared with Dragon/digital dictation.  Any transcriptional errors that result are unintentional. Harrington ChallengerYOUNG,Erika J St Landry Extended Care HospitalWHNP, 9:09 AM 11/23/2013

## 2013-11-23 NOTE — Patient Instructions (Signed)
Health Recommendations for Postmenopausal Women Respected and ongoing research has looked at the most common causes of death, disability, and poor quality of life in postmenopausal women. The causes include heart disease, diseases of blood vessels, diabetes, depression, cancer, and bone loss (osteoporosis). Many things can be done to help lower the chances of developing these and other common problems: CARDIOVASCULAR DISEASE Heart Disease: A heart attack is a medical emergency. Know the signs and symptoms of a heart attack. Below are things women can do to reduce their risk for heart disease.   Do not smoke. If you smoke, quit.  Aim for a healthy weight. Being overweight causes many preventable deaths. Eat a healthy and balanced diet and drink an adequate amount of liquids.  Get moving. Make a commitment to be more physically active. Aim for 30 minutes of activity on most, if not all days of the week.  Eat for heart health. Choose a diet that is low in saturated fat and cholesterol and eliminate trans fat. Include whole grains, vegetables, and fruits. Read and understand the labels on food containers before buying.  Know your numbers. Ask your caregiver to check your blood pressure, cholesterol (total, HDL, LDL, triglycerides) and blood glucose. Work with your caregiver on improving your entire clinical picture.  High blood pressure. Limit or stop your table salt intake (try salt substitute and food seasonings). Avoid salty foods and drinks. Read labels on food containers before buying. Eating well and exercising can help control high blood pressure. STROKE  Stroke is a medical emergency. Stroke may be the result of a blood clot in a blood vessel in the brain or by a brain hemorrhage (bleeding). Know the signs and symptoms of a stroke. To lower the risk of developing a stroke:  Avoid fatty foods.  Quit smoking.  Control your diabetes, blood pressure, and irregular heart rate. THROMBOPHLEBITIS  (BLOOD CLOT) OF THE LEG  Becoming overweight and leading a stationary lifestyle may also contribute to developing blood clots. Controlling your diet and exercising will help lower the risk of developing blood clots. CANCER SCREENING  Breast Cancer: Take steps to reduce your risk of breast cancer.  You should practice "breast self-awareness." This means understanding the normal appearance and feel of your breasts and should include breast self-examination. Any changes detected, no matter how small, should be reported to your caregiver.  After age 40, you should have a clinical breast exam (CBE) every year.  Starting at age 40, you should consider having a mammogram (breast X-ray) every year.  If you have a family history of breast cancer, talk to your caregiver about genetic screening.  If you are at high risk for breast cancer, talk to your caregiver about having an MRI and a mammogram every year.  Intestinal or Stomach Cancer: Tests to consider are a rectal exam, fecal occult blood, sigmoidoscopy, and colonoscopy. Women who are high risk may need to be screened at an earlier age and more often.  Cervical Cancer:  Beginning at age 30, you should have a Pap test every 3 years as long as the past 3 Pap tests have been normal.  If you have had past treatment for cervical cancer or a condition that could lead to cancer, you need Pap tests and screening for cancer for at least 20 years after your treatment.  If you had a hysterectomy for a problem that was not cancer or a condition that could lead to cancer, then you no longer need Pap tests.    If you are between ages 65 and 70, and you have had normal Pap tests going back 10 years, you no longer need Pap tests.  If Pap tests have been discontinued, risk factors (such as a new sexual partner) need to be reassessed to determine if screening should be resumed.  Some medical problems can increase the chance of getting cervical cancer. In these  cases, your caregiver may recommend more frequent screening and Pap tests.  Uterine Cancer: If you have vaginal bleeding after reaching menopause, you should notify your caregiver.  Ovarian cancer: Other than yearly pelvic exams, there are no reliable tests available to screen for ovarian cancer at this time except for yearly pelvic exams.  Lung Cancer: Yearly chest X-rays can detect lung cancer and should be done on high risk women, such as cigarette smokers and women with chronic lung disease (emphysema).  Skin Cancer: A complete body skin exam should be done at your yearly examination. Avoid overexposure to the sun and ultraviolet light lamps. Use a strong sun block cream when in the sun. All of these things are important in lowering the risk of skin cancer. MENOPAUSE Menopause Symptoms: Hormone therapy products are effective for treating symptoms associated with menopause:  Moderate to severe hot flashes.  Night sweats.  Mood swings.  Headaches.  Tiredness.  Loss of sex drive.  Insomnia.  Other symptoms. Hormone replacement carries certain risks, especially in older women. Women who use or are thinking about using estrogen or estrogen with progestin treatments should discuss that with their caregiver. Your caregiver will help you understand the benefits and risks. The ideal dose of hormone replacement therapy is not known. The Food and Drug Administration (FDA) has concluded that hormone therapy should be used only at the lowest doses and for the shortest amount of time to reach treatment goals.  OSTEOPOROSIS Protecting Against Bone Loss and Preventing Fracture: If you use hormone therapy for prevention of bone loss (osteoporosis), the risks for bone loss must outweigh the risk of the therapy. Ask your caregiver about other medications known to be safe and effective for preventing bone loss and fractures. To guard against bone loss or fractures, the following is recommended:  If  you are less than age 50, take 1000 mg of calcium and at least 600 mg of Vitamin D per day.  If you are greater than age 50 but less than age 70, take 1200 mg of calcium and at least 600 mg of Vitamin D per day.  If you are greater than age 70, take 1200 mg of calcium and at least 800 mg of Vitamin D per day. Smoking and excessive alcohol intake increases the risk of osteoporosis. Eat foods rich in calcium and vitamin D and do weight bearing exercises several times a week as your caregiver suggests. DIABETES Diabetes Melitus: If you have Type I or Type 2 diabetes, you should keep your blood sugar under control with diet, exercise and recommended medication. Avoid too many sweets, starchy and fatty foods. Being overweight can make control more difficult. COGNITION AND MEMORY Cognition and Memory: Menopausal hormone therapy is not recommended for the prevention of cognitive disorders such as Alzheimer's disease or memory loss.  DEPRESSION  Depression may occur at any age, but is common in elderly women. The reasons may be because of physical, medical, social (loneliness), or financial problems and needs. If you are experiencing depression because of medical problems and control of symptoms, talk to your caregiver about this. Physical activity and   exercise may help with mood and sleep. Community and volunteer involvement may help your sense of value and worth. If you have depression and you feel that the problem is getting worse or becoming severe, talk to your caregiver about treatment options that are best for you. ACCIDENTS  Accidents are common and can be serious in the elderly woman. Prepare your house to prevent accidents. Eliminate throw rugs, place hand bars in the bath, shower and toilet areas. Avoid wearing high heeled shoes or walking on wet, snowy, and icy areas. Limit or stop driving if you have vision or hearing problems, or you feel you are unsteady with you movements and  reflexes. HEPATITIS C Hepatitis C is a type of viral infection affecting the liver. It is spread mainly through contact with blood from an infected person. It can be treated, but if left untreated, it can lead to severe liver damage over years. Many people who are infected do not know that the virus is in their blood. If you are a "baby-boomer", it is recommended that you have one screening test for Hepatitis C. IMMUNIZATIONS  Several immunizations are important to consider having during your senior years, including:   Tetanus, diptheria, and pertussis booster shot.  Influenza every year before the flu season begins.  Pneumonia vaccine.  Shingles vaccine.  Others as indicated based on your specific needs. Talk to your caregiver about these. Document Released: 07/19/2005 Document Revised: 05/13/2012 Document Reviewed: 03/14/2008 [employer] Patient Information 2014 [employer].  

## 2013-11-24 LAB — URINALYSIS W MICROSCOPIC + REFLEX CULTURE
BACTERIA UA: NONE SEEN
BILIRUBIN URINE: NEGATIVE
Casts: NONE SEEN
Crystals: NONE SEEN
GLUCOSE, UA: NEGATIVE mg/dL
Hgb urine dipstick: NEGATIVE
Ketones, ur: NEGATIVE mg/dL
Leukocytes, UA: NEGATIVE
Nitrite: NEGATIVE
PROTEIN: NEGATIVE mg/dL
Specific Gravity, Urine: <1.005 — ABNORMAL LOW (ref 1.005–1.030)
Squamous Epithelial / LPF: NONE SEEN
UROBILINOGEN UA: 0.2 mg/dL (ref 0.0–1.0)
pH: 6.5 (ref 5.0–8.0)

## 2013-11-26 ENCOUNTER — Encounter: Payer: Self-pay | Admitting: Women's Health

## 2013-11-26 LAB — VITAMIN D 1,25 DIHYDROXY
VITAMIN D 1, 25 (OH) TOTAL: 57 pg/mL (ref 18–72)
VITAMIN D3 1, 25 (OH): 57 pg/mL
Vitamin D2 1, 25 (OH)2: <8 pg/mL

## 2013-12-02 ENCOUNTER — Other Ambulatory Visit: Payer: Self-pay | Admitting: Gynecology

## 2013-12-02 DIAGNOSIS — M8580 Other specified disorders of bone density and structure, unspecified site: Secondary | ICD-10-CM

## 2013-12-14 ENCOUNTER — Telehealth: Payer: Self-pay | Admitting: Gynecology

## 2013-12-14 ENCOUNTER — Other Ambulatory Visit: Payer: Self-pay | Admitting: Gynecology

## 2013-12-14 ENCOUNTER — Ambulatory Visit (INDEPENDENT_AMBULATORY_CARE_PROVIDER_SITE_OTHER): Payer: Managed Care, Other (non HMO)

## 2013-12-14 ENCOUNTER — Encounter: Payer: Self-pay | Admitting: Gynecology

## 2013-12-14 DIAGNOSIS — M810 Age-related osteoporosis without current pathological fracture: Secondary | ICD-10-CM

## 2013-12-14 DIAGNOSIS — M8580 Other specified disorders of bone density and structure, unspecified site: Secondary | ICD-10-CM

## 2013-12-14 NOTE — Telephone Encounter (Signed)
Tell patient that her bone density showed osteoporosis. Recommend TSH, PTH, comprehensive metabolic panel, office visit to discuss treatment options

## 2013-12-16 NOTE — Telephone Encounter (Signed)
Pt informed with the below note, pt will have labs done on 12/20/13 @ 10:00 am

## 2013-12-20 ENCOUNTER — Other Ambulatory Visit: Payer: Managed Care, Other (non HMO)

## 2013-12-20 ENCOUNTER — Other Ambulatory Visit: Payer: Self-pay | Admitting: Gynecology

## 2013-12-20 DIAGNOSIS — M810 Age-related osteoporosis without current pathological fracture: Secondary | ICD-10-CM

## 2013-12-20 LAB — COMPREHENSIVE METABOLIC PANEL
ALK PHOS: 58 U/L (ref 39–117)
ALT: 51 U/L — ABNORMAL HIGH (ref 0–35)
AST: 38 U/L — ABNORMAL HIGH (ref 0–37)
Albumin: 4.4 g/dL (ref 3.5–5.2)
BUN: 11 mg/dL (ref 6–23)
CO2: 28 meq/L (ref 19–32)
Calcium: 9.2 mg/dL (ref 8.4–10.5)
Chloride: 105 mEq/L (ref 96–112)
Creat: 0.82 mg/dL (ref 0.50–1.10)
GLUCOSE: 79 mg/dL (ref 70–99)
Potassium: 4.9 mEq/L (ref 3.5–5.3)
SODIUM: 139 meq/L (ref 135–145)
TOTAL PROTEIN: 6.2 g/dL (ref 6.0–8.3)
Total Bilirubin: 0.3 mg/dL (ref 0.2–1.2)

## 2013-12-21 LAB — TSH: TSH: 2.217 u[IU]/mL (ref 0.350–4.500)

## 2013-12-21 LAB — PARATHYROID HORMONE, INTACT (NO CA): PTH: 70.6 pg/mL (ref 14.0–72.0)

## 2013-12-22 ENCOUNTER — Other Ambulatory Visit: Payer: Self-pay | Admitting: Gynecology

## 2013-12-22 DIAGNOSIS — R7989 Other specified abnormal findings of blood chemistry: Secondary | ICD-10-CM

## 2013-12-22 DIAGNOSIS — R945 Abnormal results of liver function studies: Principal | ICD-10-CM

## 2014-01-04 ENCOUNTER — Other Ambulatory Visit: Payer: Managed Care, Other (non HMO)

## 2014-01-04 DIAGNOSIS — R945 Abnormal results of liver function studies: Principal | ICD-10-CM

## 2014-01-04 DIAGNOSIS — R7989 Other specified abnormal findings of blood chemistry: Secondary | ICD-10-CM

## 2014-01-04 LAB — HEPATIC FUNCTION PANEL
ALBUMIN: 4.4 g/dL (ref 3.5–5.2)
ALT: 32 U/L (ref 0–35)
AST: 30 U/L (ref 0–37)
Alkaline Phosphatase: 54 U/L (ref 39–117)
BILIRUBIN TOTAL: 0.5 mg/dL (ref 0.2–1.2)
Bilirubin, Direct: 0.1 mg/dL (ref 0.0–0.3)
Indirect Bilirubin: 0.4 mg/dL (ref 0.2–1.2)
TOTAL PROTEIN: 6.5 g/dL (ref 6.0–8.3)

## 2014-01-11 ENCOUNTER — Ambulatory Visit: Payer: Managed Care, Other (non HMO) | Admitting: Gynecology

## 2014-02-01 ENCOUNTER — Ambulatory Visit (INDEPENDENT_AMBULATORY_CARE_PROVIDER_SITE_OTHER): Payer: Managed Care, Other (non HMO) | Admitting: Gynecology

## 2014-02-01 ENCOUNTER — Encounter: Payer: Self-pay | Admitting: Gynecology

## 2014-02-01 DIAGNOSIS — M810 Age-related osteoporosis without current pathological fracture: Secondary | ICD-10-CM

## 2014-02-01 MED ORDER — ALENDRONATE SODIUM 70 MG PO TABS: 70.0000 mg | ORAL_TABLET | ORAL | Status: DC

## 2014-02-01 NOTE — Patient Instructions (Signed)
Start on the Fosamax (alendronate) as we discussed. Call me if you have any issues.  Alendronate tablets What is this medicine? ALENDRONATE (a LEN droe nate) slows calcium loss from bones. It helps to make normal healthy bone and to slow bone loss in people with Paget's disease and osteoporosis. It may be used in others at risk for bone loss. This medicine may be used for other purposes; ask your health care provider or pharmacist if you have questions. COMMON BRAND NAME(S): Fosamax What should I tell my health care provider before I take this medicine? They need to know if you have any of these conditions: -dental disease -esophagus, stomach, or intestine problems, like acid reflux or GERD -kidney disease -low blood calcium -low vitamin D -problems sitting or standing 30 minutes -trouble swallowing -an unusual or allergic reaction to alendronate, other medicines, foods, dyes, or preservatives -pregnant or trying to get pregnant -breast-feeding How should I use this medicine? You must take this medicine exactly as directed or you will lower the amount of the medicine you absorb into your body or you may cause yourself harm. Take this medicine by mouth first thing in the morning, after you are up for the day. Do not eat or drink anything before you take your medicine. Swallow the tablet with a full glass (6 to 8 fluid ounces) of plain water. Do not take this medicine with any other drink. Do not chew or crush the tablet. After taking this medicine, do not eat breakfast, drink, or take any medicines or vitamins for at least 30 minutes. Sit or stand up for at least 30 minutes after you take this medicine; do not lie down. Do not take your medicine more often than directed. Talk to your pediatrician regarding the use of this medicine in children. Special care may be needed. Overdosage: If you think you have taken too much of this medicine contact a poison control center or emergency room at  once. NOTE: This medicine is only for you. Do not share this medicine with others. What if I miss a dose? If you miss a dose, do not take it later in the day. Continue your normal schedule starting the next morning. Do not take double or extra doses. What may interact with this medicine? -aluminum hydroxide -antacids -aspirin -calcium supplements -drugs for inflammation like ibuprofen, naproxen, and others -iron supplements -magnesium supplements -vitamins with minerals This list may not describe all possible interactions. Give your health care provider a list of all the medicines, herbs, non-prescription drugs, or dietary supplements you use. Also tell them if you smoke, drink alcohol, or use illegal drugs. Some items may interact with your medicine. What should I watch for while using this medicine? Visit your doctor or health care professional for regular checks ups. It may be some time before you see benefit from this medicine. Do not stop taking your medicine except on your doctor's advice. Your doctor or health care professional may order blood tests and other tests to see how you are doing. You should make sure you get enough calcium and vitamin D while you are taking this medicine, unless your doctor tells you not to. Discuss the foods you eat and the vitamins you take with your health care professional. Some people who take this medicine have severe bone, joint, and/or muscle pain. This medicine may also increase your risk for a broken thigh bone. Tell your doctor right away if you have pain in your upper leg or groin. Tell  your doctor if you have any pain that does not go away or that gets worse. This medicine can make you more sensitive to the sun. If you get a rash while taking this medicine, sunlight may cause the rash to get worse. Keep out of the sun. If you cannot avoid being in the sun, wear protective clothing and use sunscreen. Do not use sun lamps or tanning beds/booths. What  side effects may I notice from receiving this medicine? Side effects that you should report to your doctor or health care professional as soon as possible: -allergic reactions like skin rash, itching or hives, swelling of the face, lips, or tongue -black or tarry stools -bone, muscle or joint pain -changes in vision -chest pain -heartburn or stomach pain -jaw pain, especially after dental work -pain or trouble when swallowing -redness, blistering, peeling or loosening of the skin, including inside the mouth Side effects that usually do not require medical attention (report to your doctor or health care professional if they continue or are bothersome): -changes in taste -diarrhea or constipation -eye pain or itching -headache -nausea or vomiting -stomach gas or fullness This list may not describe all possible side effects. Call your doctor for medical advice about side effects. You may report side effects to FDA at 1-800-FDA-1088. Where should I keep my medicine? Keep out of the reach of children. Store at room temperature of 15 and 30 degrees C (59 and 86 degrees F). Throw away any unused medicine after the expiration date. NOTE: This sheet is a summary. It may not cover all possible information. If you have questions about this medicine, talk to your doctor, pharmacist, or health care provider.  2015, Elsevier/Gold Standard. (2010-11-23 08:56:09)

## 2014-02-01 NOTE — Progress Notes (Signed)
Erika Davis January 07, 1956 [id number]        58 y.o.  [id number] presents to discuss her most recent DEXA showing T score -2.6 left femoral neck -2.2 right femoral neck. This had a statistically significant decline at all measured site since her prior bone densities.  Past medical history,surgical history, problem list, medications, allergies, family history and social history were all reviewed and documented in the EPIC chart.  Directed ROS with pertinent positives and negatives documented in the history of present illness/assessment and plan.   Assessment/Plan:  58 y.o. [id number] with osteoporosis. I reviewed the whole issue of bone health and her diagnosis of osteoporosis and the statistically significant loss from her prior bone densities. I discussed behavior modification to include increasing weightbearing exercise a maximum vitamin D. Alcohol restriction also discussed. She never smoked. Recent TSH normal. Recent vitamin D 57. I reviewed treatment options with her in particular bisphosphonates. I discussed how to take the medication, the side effects and risks. I discussed osteonecrosis of the jaw, atypical fractures and esophageal disease. After lengthy discussion the patient wants to go ahead and start weekly alendronate 70 mg. Annual prescription was written. Patient will followup with me if she has any issues otherwise we'll plan on repeating her DEXA in 2 years.   Note: This document was prepared with digital dictation and possible smart phrase technology. Any transcriptional errors that result from this process are unintentional.   Dara Lords MD, 1:00 PM 02/01/2014

## 2014-04-11 ENCOUNTER — Encounter: Payer: Self-pay | Admitting: Gynecology

## 2014-06-15 ENCOUNTER — Other Ambulatory Visit (HOSPITAL_COMMUNITY): Payer: Self-pay | Admitting: Otolaryngology

## 2014-06-15 DIAGNOSIS — K1379 Other lesions of oral mucosa: Secondary | ICD-10-CM

## 2014-06-22 ENCOUNTER — Ambulatory Visit (HOSPITAL_COMMUNITY)
Admission: RE | Admit: 2014-06-22 | Discharge: 2014-06-22 | Disposition: A | Discharge status: Home / Self Care | Payer: BLUE CROSS/BLUE SHIELD | Source: Ambulatory Visit | Attending: Otolaryngology | Admitting: Otolaryngology

## 2014-06-22 DIAGNOSIS — K1379 Other lesions of oral mucosa: Secondary | ICD-10-CM

## 2014-06-22 DIAGNOSIS — R220 Localized swelling, mass and lump, head: Secondary | ICD-10-CM | POA: Insufficient documentation

## 2014-07-12 ENCOUNTER — Encounter: Payer: Self-pay | Admitting: Women's Health

## 2014-08-17 ENCOUNTER — Other Ambulatory Visit: Payer: Self-pay

## 2014-08-17 DIAGNOSIS — F411 Generalized anxiety disorder: Secondary | ICD-10-CM

## 2014-08-17 MED ORDER — FLUOXETINE HCL 20 MG PO CAPS: 20.0000 mg | ORAL_CAPSULE | Freq: Every day | ORAL | Status: DC

## 2014-09-23 ENCOUNTER — Telehealth: Payer: Self-pay | Admitting: *Deleted

## 2014-09-23 NOTE — Telephone Encounter (Signed)
Pt called and left message in triage requesting Rx, I called pt back to let her know OV best, pt did state in voicemail she is not having any pain. Urgency only

## 2014-09-27 ENCOUNTER — Ambulatory Visit: Payer: Managed Care, Other (non HMO) | Admitting: Women's Health

## 2014-11-25 ENCOUNTER — Encounter: Payer: Managed Care, Other (non HMO) | Admitting: Women's Health

## 2014-12-09 ENCOUNTER — Ambulatory Visit (INDEPENDENT_AMBULATORY_CARE_PROVIDER_SITE_OTHER): Payer: BLUE CROSS/BLUE SHIELD | Admitting: Women's Health

## 2014-12-09 ENCOUNTER — Encounter: Payer: Self-pay | Admitting: Women's Health

## 2014-12-09 ENCOUNTER — Other Ambulatory Visit (HOSPITAL_COMMUNITY)
Admission: RE | Admit: 2014-12-09 | Discharge: 2014-12-09 | Disposition: A | Discharge status: Home / Self Care | Payer: BLUE CROSS/BLUE SHIELD | Source: Ambulatory Visit | Attending: Women's Health | Admitting: Women's Health

## 2014-12-09 VITALS — BP 110/80 | Ht 63.0 in | Wt 142.0 lb

## 2014-12-09 DIAGNOSIS — M8580 Other specified disorders of bone density and structure, unspecified site: Secondary | ICD-10-CM

## 2014-12-09 DIAGNOSIS — F411 Generalized anxiety disorder: Secondary | ICD-10-CM

## 2014-12-09 DIAGNOSIS — Z01419 Encounter for gynecological examination (general) (routine) without abnormal findings: Secondary | ICD-10-CM

## 2014-12-09 DIAGNOSIS — Z1151 Encounter for screening for human papillomavirus (HPV): Secondary | ICD-10-CM | POA: Insufficient documentation

## 2014-12-09 MED ORDER — FLUOXETINE HCL 20 MG PO CAPS: 20.0000 mg | ORAL_CAPSULE | Freq: Every day | ORAL | Status: DC

## 2014-12-09 MED ORDER — ALENDRONATE SODIUM 70 MG PO TABS: 70.0000 mg | ORAL_TABLET | ORAL | Status: DC

## 2014-12-09 NOTE — Addendum Note (Signed)
Addended by: Kem ParkinsonBARNES, Rhetta Cleek on: 12/09/2014 12:45 PM   Modules accepted: Orders

## 2014-12-09 NOTE — Progress Notes (Signed)
Erika LenzLeesa A Davis 06/13/1955 [id number]    History:    Presents for annual exam.  Postmenopausal/no bleeding/no HRT. Normal Pap and mammogram history. Doing well on Prozac 20 mg daily. 12/2013 T score -2.6 and left femoral neck significant decrease, Fosamax 70 weekly started, tolerating well. Age 59 benign colon polyp due for repeat screening next year. Primary care manages labs/ hypercholesterolemia. Vitamin D level 57.  Past medical history, past surgical history, family history and social history were all reviewed and documented in the EPIC chart. Retired from [employer]. Husband prostate cancer doing better. Both daughter and son doing well 3 grandchildren. Works one day a week at [employer], and helps care for mother-in-law.  ROS:  A ROS was performed and pertinent positives and negatives are included.  Exam:  Filed Vitals:   12/09/14 1122  BP: 110/80    General appearance:  Normal Thyroid:  Symmetrical, normal in size, without palpable masses or nodularity. Respiratory  Auscultation:  Clear without wheezing or rhonchi Cardiovascular  Auscultation:  Regular rate, without rubs, murmurs or gallops  Edema/varicosities:  Not grossly evident Abdominal  Soft,nontender, without masses, guarding or rebound.  Liver/spleen:  No organomegaly noted  Hernia:  None appreciated  Skin  Inspection:  Grossly normal   Breasts: Examined lying and sitting.     Right: Without masses, retractions, discharge or axillary adenopathy.     Left: Without masses, retractions, discharge or axillary adenopathy. Gentitourinary   Inguinal/mons:  Normal without inguinal adenopathy  External genitalia:  Normal  BUS/Urethra/Skene's glands:  Normal  Vagina:  Normal  Cervix:  Normal  Uterus:  normal in size, shape and contour.  Midline and mobile  Adnexa/parametria:     Rt: Without masses or tenderness.   Lt: Without masses or tenderness.  Anus and perineum: Normal  Digital rectal exam: Normal  sphincter tone without palpated masses or tenderness  Assessment/Plan:  59 y.o. MWF G3 P2 for annual exam is no complaints.  Postmenopausal/no HRT/no bleeding Osteoporosis on Fosamax since 12/2013 tolerating well Hypercholesteremia-primary care manages labs and meds Mild anxiety and depression stable on Prozac  Plan: SBE's, continue annual screening mammogram, calcium rich diet, vitamin D 1000 daily encouraged. Fosamax 70 mg weekly prescription, proper use given and reviewed osteogenesis, stopping medication prior to dental work. Home safety, fall prevention and importance of regular weightbearing exercise reviewed. Prilosec 20 mg by mouth daily prescription, proper use given and reviewed, denies need for counseling at this time. Doing well. UA, Pap with HR HPV typing, new screening guidelines reviewed.     Harrington ChallengerYOUNG,Angeliyah Kirkey J [facility] CenterWHNP, 12:07 PM 12/09/2014

## 2014-12-09 NOTE — Patient Instructions (Signed)
Health Recommendations for Postmenopausal Women Respected and ongoing research has looked at the most common causes of death, disability, and poor quality of life in postmenopausal women. The causes include heart disease, diseases of blood vessels, diabetes, depression, cancer, and bone loss (osteoporosis). Many things can be done to help lower the chances of developing these and other common problems. CARDIOVASCULAR DISEASE Heart Disease: A heart attack is a medical emergency. Know the signs and symptoms of a heart attack. Below are things women can do to reduce their risk for heart disease.   Do not smoke. If you smoke, quit.  Aim for a healthy weight. Being overweight causes many preventable deaths. Eat a healthy and balanced diet and drink an adequate amount of liquids.  Get moving. Make a commitment to be more physically active. Aim for 30 minutes of activity on most, if not all days of the week.  Eat for heart health. Choose a diet that is low in saturated fat and cholesterol and eliminate trans fat. Include whole grains, vegetables, and fruits. Read and understand the labels on food containers before buying.  Know your numbers. Ask your caregiver to check your blood pressure, cholesterol (total, HDL, LDL, triglycerides) and blood glucose. Work with your caregiver on improving your entire clinical picture.  High blood pressure. Limit or stop your table salt intake (try salt substitute and food seasonings). Avoid salty foods and drinks. Read labels on food containers before buying. Eating well and exercising can help control high blood pressure. STROKE  Stroke is a medical emergency. Stroke may be the result of a blood clot in a blood vessel in the brain or by a brain hemorrhage (bleeding). Know the signs and symptoms of a stroke. To lower the risk of developing a stroke:  Avoid fatty foods.  Quit smoking.  Control your diabetes, blood pressure, and irregular heart  rate. THROMBOPHLEBITIS (BLOOD CLOT) OF THE LEG  Becoming overweight and leading a stationary lifestyle may also contribute to developing blood clots. Controlling your diet and exercising will help lower the risk of developing blood clots. CANCER SCREENING  Breast Cancer: Take steps to reduce your risk of breast cancer.  You should practice "breast self-awareness." This means understanding the normal appearance and feel of your breasts and should include breast self-examination. Any changes detected, no matter how small, should be reported to your caregiver.  After age 40, you should have a clinical breast exam (CBE) every year.  Starting at age 40, you should consider having a mammogram (breast X-ray) every year.  If you have a family history of breast cancer, talk to your caregiver about genetic screening.  If you are at high risk for breast cancer, talk to your caregiver about having an MRI and a mammogram every year.  Intestinal or Stomach Cancer: Tests to consider are a rectal exam, fecal occult blood, sigmoidoscopy, and colonoscopy. Women who are high risk may need to be screened at an earlier age and more often.  Cervical Cancer:  Beginning at age 30, you should have a Pap test every 3 years as long as the past 3 Pap tests have been normal.  If you have had past treatment for cervical cancer or a condition that could lead to cancer, you need Pap tests and screening for cancer for at least 20 years after your treatment.  If you had a hysterectomy for a problem that was not cancer or a condition that could lead to cancer, then you no longer need Pap tests.    If you are between ages 65 and 70, and you have had normal Pap tests going back 10 years, you no longer need Pap tests.  If Pap tests have been discontinued, risk factors (such as a new sexual partner) need to be reassessed to determine if screening should be resumed.  Some medical problems can increase the chance of getting  cervical cancer. In these cases, your caregiver may recommend more frequent screening and Pap tests.  Uterine Cancer: If you have vaginal bleeding after reaching menopause, you should notify your caregiver.  Ovarian Cancer: Other than yearly pelvic exams, there are no reliable tests available to screen for ovarian cancer at this time except for yearly pelvic exams.  Lung Cancer: Yearly chest X-rays can detect lung cancer and should be done on high risk women, such as cigarette smokers and women with chronic lung disease (emphysema).  Skin Cancer: A complete body skin exam should be done at your yearly examination. Avoid overexposure to the sun and ultraviolet light lamps. Use a strong sun block cream when in the sun. All of these things are important for lowering the risk of skin cancer. MENOPAUSE Menopause Symptoms: Hormone therapy products are effective for treating symptoms associated with menopause:  Moderate to severe hot flashes.  Night sweats.  Mood swings.  Headaches.  Tiredness.  Loss of sex drive.  Insomnia.  Other symptoms. Hormone replacement carries certain risks, especially in older women. Women who use or are thinking about using estrogen or estrogen with progestin treatments should discuss that with their caregiver. Your caregiver will help you understand the benefits and risks. The ideal dose of hormone replacement therapy is not known. The Food and Drug Administration (FDA) has concluded that hormone therapy should be used only at the lowest doses and for the shortest amount of time to reach treatment goals.  OSTEOPOROSIS Protecting Against Bone Loss and Preventing Fracture If you use hormone therapy for prevention of bone loss (osteoporosis), the risks for bone loss must outweigh the risk of the therapy. Ask your caregiver about other medications known to be safe and effective for preventing bone loss and fractures. To guard against bone loss or fractures, the  following is recommended:  If you are younger than age 50, take 1000 mg of calcium and at least 600 mg of Vitamin D per day.  If you are older than age 50 but younger than age 70, take 1200 mg of calcium and at least 600 mg of Vitamin D per day.  If you are older than age 70, take 1200 mg of calcium and at least 800 mg of Vitamin D per day. Smoking and excessive alcohol intake increases the risk of osteoporosis. Eat foods rich in calcium and vitamin D and do weight bearing exercises several times a week as your caregiver suggests. DIABETES Diabetes Mellitus: If you have type I or type 2 diabetes, you should keep your blood sugar under control with diet, exercise, and recommended medication. Avoid starchy and fatty foods, and too many sweets. Being overweight can make diabetes control more difficult. COGNITION AND MEMORY Cognition and Memory: Menopausal hormone therapy is not recommended for the prevention of cognitive disorders such as Alzheimer's disease or memory loss.  DEPRESSION  Depression may occur at any age, but it is common in elderly women. This may be because of physical, medical, social (loneliness), or financial problems and needs. If you are experiencing depression because of medical problems and control of symptoms, talk to your caregiver about this. Physical   activity and exercise may help with mood and sleep. Community and volunteer involvement may improve your sense of value and worth. If you have depression and you feel that the problem is getting worse or becoming severe, talk to your caregiver about which treatment options are best for you. ACCIDENTS  Accidents are common and can be serious in elderly woman. Prepare your house to prevent accidents. Eliminate throw rugs, place hand bars in bath, shower, and toilet areas. Avoid wearing high heeled shoes or walking on wet, snowy, and icy areas. Limit or stop driving if you have vision or hearing problems, or if you feel you are  unsteady with your movements and reflexes. HEPATITIS C Hepatitis C is a type of viral infection affecting the liver. It is spread mainly through contact with blood from an infected person. It can be treated, but if left untreated, it can lead to severe liver damage over the years. Many people who are infected do not know that the virus is in their blood. If you are a "baby-boomer", it is recommended that you have one screening test for Hepatitis C. IMMUNIZATIONS  Several immunizations are important to consider having during your senior years, including:   Tetanus, diphtheria, and pertussis booster shot.  Influenza every year before the flu season begins.  Pneumonia vaccine.  Shingles vaccine.  Others, as indicated based on your specific needs. Talk to your caregiver about these. Document Released: 07/19/2005 Document Revised: 10/11/2013 Document Reviewed: 03/14/2008 ExitCare Patient Information 2015 [employer]. This information is not intended to replace advice given to you by your health care provider. Make sure you discuss any questions you have with your health care provider. Exercise to Stay Healthy Exercise helps you become and stay healthy. EXERCISE IDEAS AND TIPS Choose exercises that:  You enjoy.  Fit into your day. You do not need to exercise really hard to be healthy. You can do exercises at a slow or medium level and stay healthy. You can:  Stretch before and after working out.  Try yoga, Pilates, or tai chi.  Lift weights.  Walk fast, swim, jog, run, climb stairs, bicycle, dance, or rollerskate.  Take aerobic classes. Exercises that burn about 150 calories:  Running 1  miles in 15 minutes.  Playing volleyball for 45 to 60 minutes.  Washing and waxing a car for 45 to 60 minutes.  Playing touch football for 45 minutes.  Walking 1  miles in 35 minutes.  Pushing a stroller 1  miles in 30 minutes.  Playing basketball for 30 minutes.  Raking leaves  for 30 minutes.  Bicycling 5 miles in 30 minutes.  Walking 2 miles in 30 minutes.  Dancing for 30 minutes.  Shoveling snow for 15 minutes.  Swimming laps for 20 minutes.  Walking up stairs for 15 minutes.  Bicycling 4 miles in 15 minutes.  Gardening for 30 to 45 minutes.  Jumping rope for 15 minutes.  Washing windows or floors for 45 to 60 minutes. Document Released: 06/29/2010 Document Revised: 08/19/2011 Document Reviewed: 06/29/2010 ExitCare Patient Information 2015 [employer]. This information is not intended to replace advice given to you by your health care provider. Make sure you discuss any questions you have with your health care provider.  

## 2014-12-10 LAB — URINALYSIS W MICROSCOPIC + REFLEX CULTURE
Bacteria, UA: NONE SEEN
Bilirubin Urine: NEGATIVE
CASTS: NONE SEEN
Crystals: NONE SEEN
GLUCOSE, UA: NEGATIVE mg/dL
HGB URINE DIPSTICK: NEGATIVE
Ketones, ur: NEGATIVE mg/dL
NITRITE: NEGATIVE
Protein, ur: NEGATIVE mg/dL
SPECIFIC GRAVITY, URINE: 1.009 (ref 1.005–1.030)
UROBILINOGEN UA: 0.2 mg/dL (ref 0.0–1.0)
pH: 5 (ref 5.0–8.0)

## 2014-12-12 LAB — URINE CULTURE: Colony Count: 50000

## 2014-12-15 LAB — CYTOLOGY - PAP

## 2015-03-02 ENCOUNTER — Other Ambulatory Visit: Payer: Self-pay

## 2015-03-02 MED ORDER — ALENDRONATE SODIUM 70 MG PO TABS: 70.0000 mg | ORAL_TABLET | ORAL | Status: DC

## 2015-04-15 ENCOUNTER — Encounter: Payer: Self-pay | Admitting: Women's Health

## 2015-07-13 ENCOUNTER — Encounter: Payer: Self-pay | Admitting: Women's Health

## 2015-07-24 ENCOUNTER — Encounter: Payer: Self-pay | Admitting: Women's Health

## 2015-12-20 IMAGING — US US SOFT TISSUE HEAD/NECK
2 series · 14 of 21 positions shown · non-contrast
Comparison: None.

CLINICAL DATA: Lump in mouth

EXAM:
ULTRASOUND OF HEAD/NECK SOFT TISSUES
TECHNIQUE: Ultrasound examination of the head and neck soft tissues was
performed in the area of clinical concern.

[Series 1: us soft tissue head/neck · 0.03mm/px · 9 of 14 slices shown (1 of 2)]
[im 1/14]
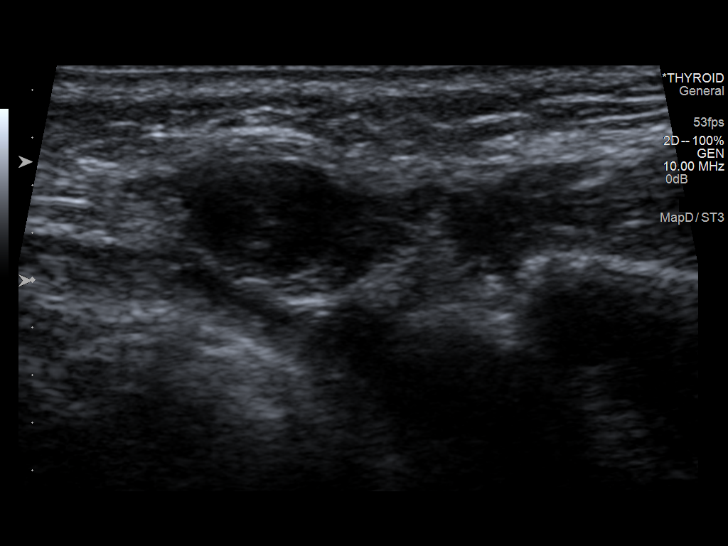
[im 3/14]
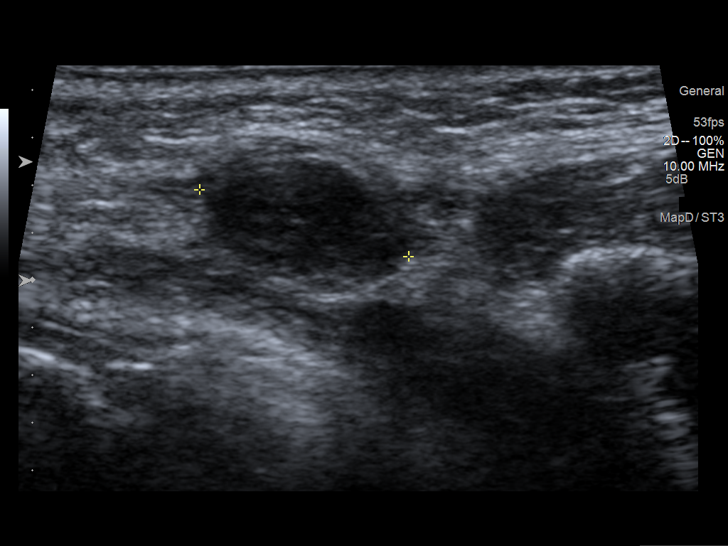
[im 4/14]
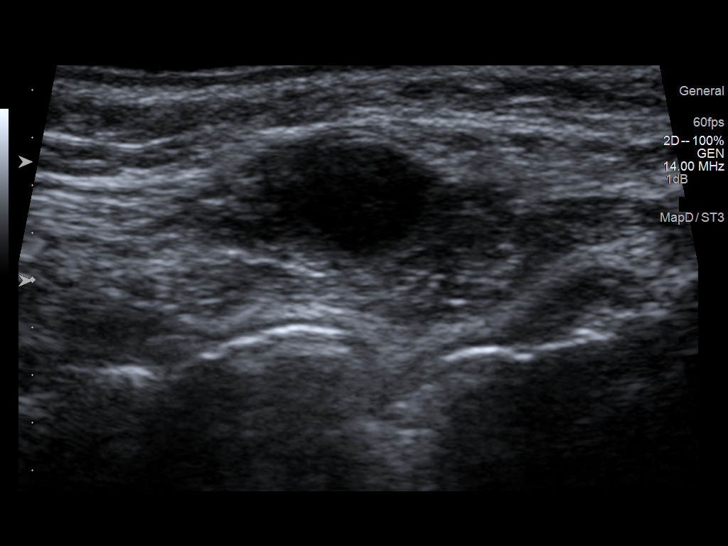
[im 6/14]
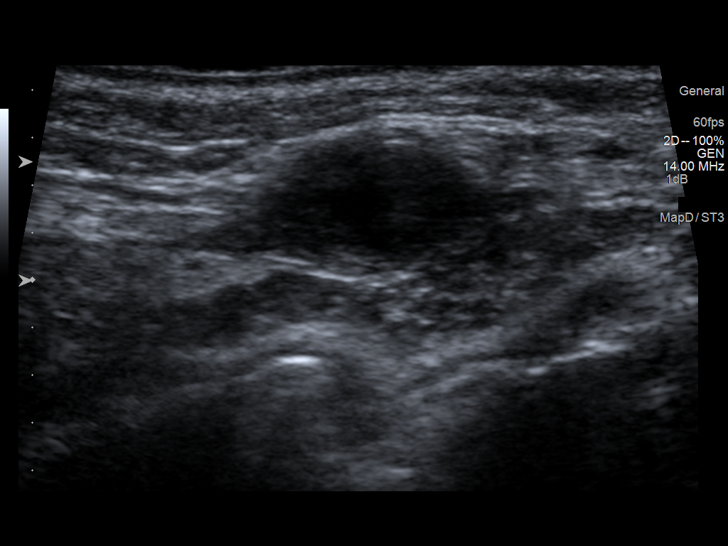
[im 7/14]
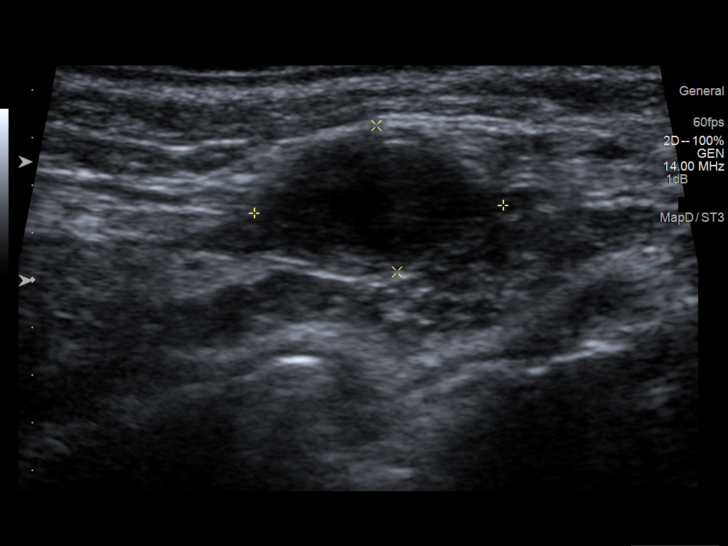
[im 9/14]
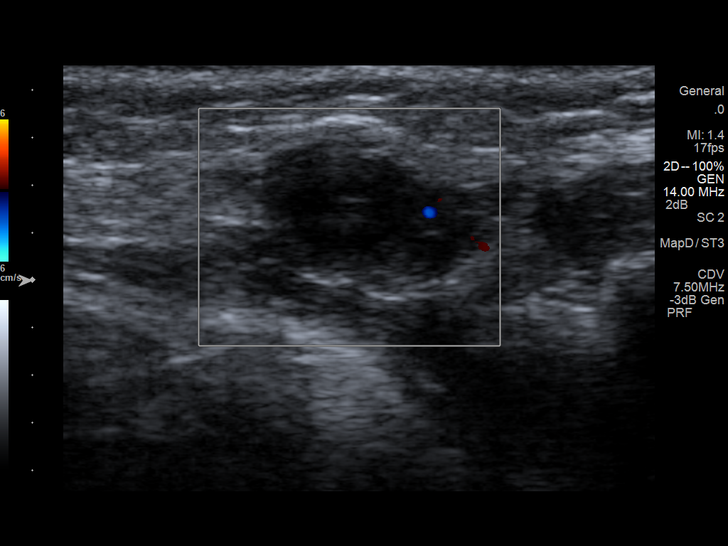
[im 10/14]
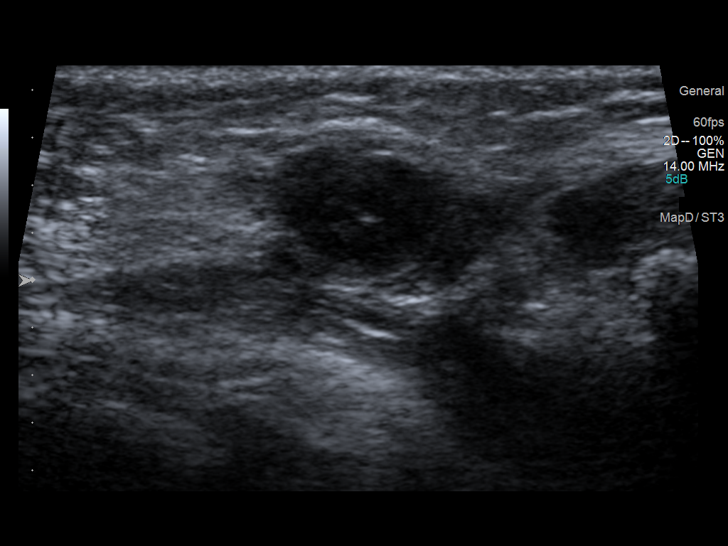
[im 12/14]
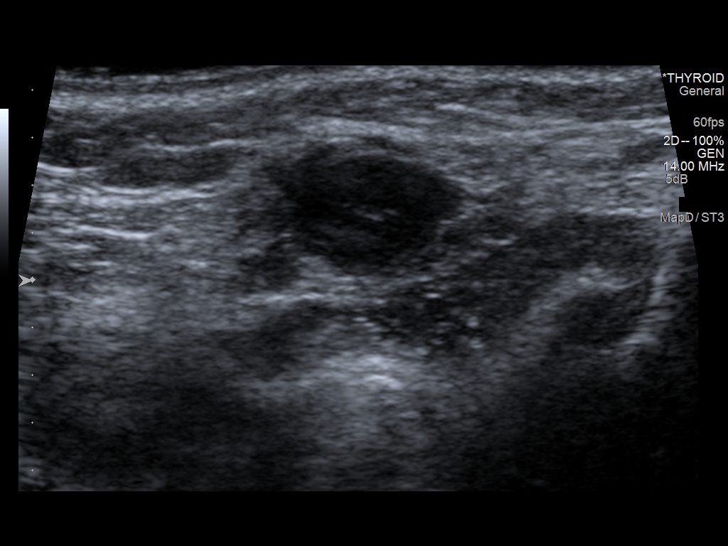
[im 13/14]
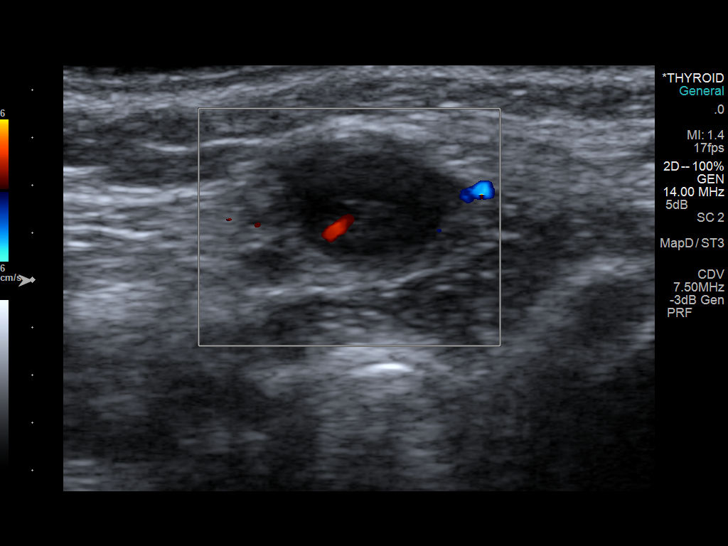

[Series 2: us soft tissue head/neck · 0.04mm/px · 5 of 7 slices shown (2 of 2)]
[im 1/7]
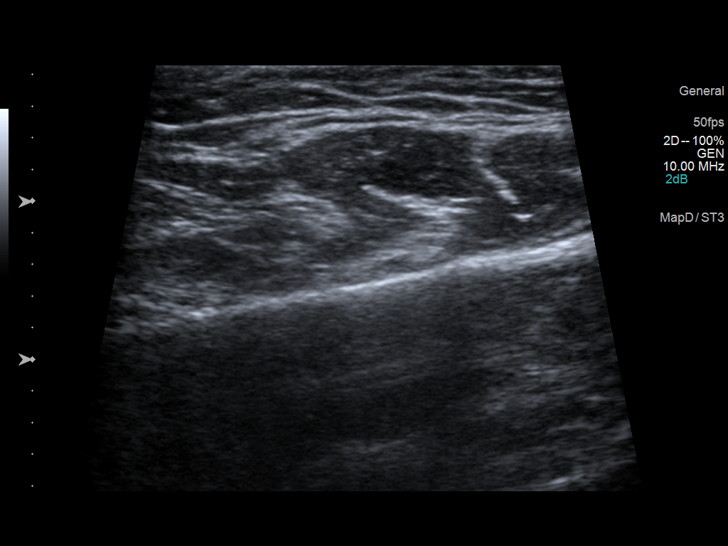
[im 2/7]
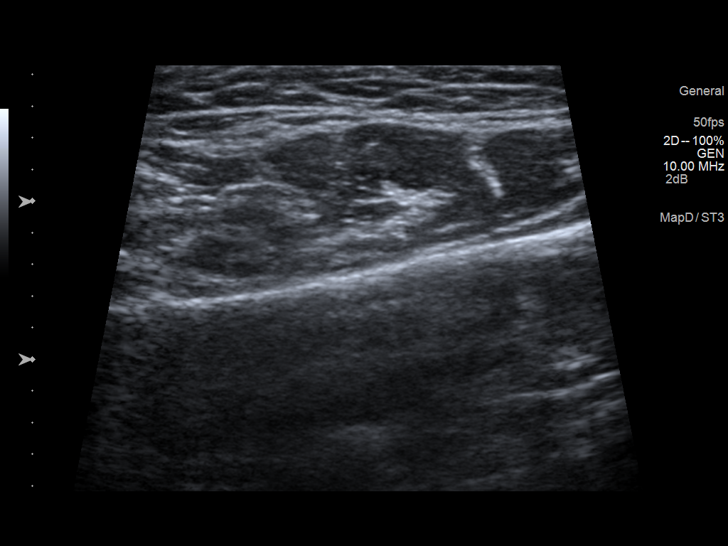
[im 4/7]
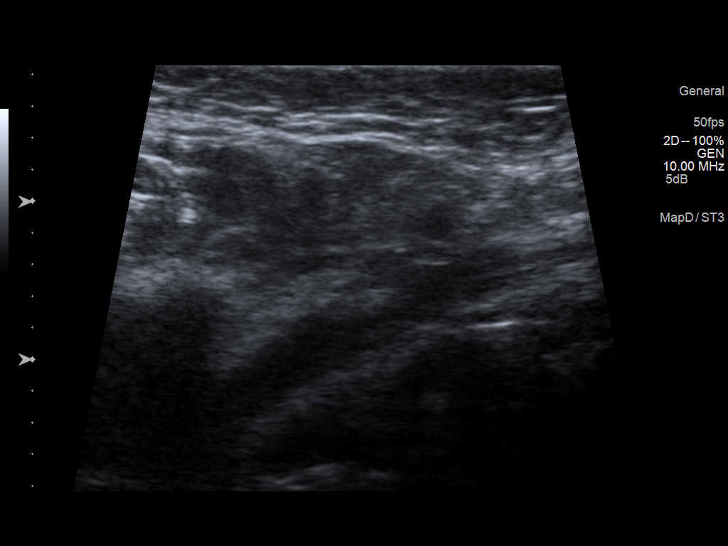
[im 5/7]
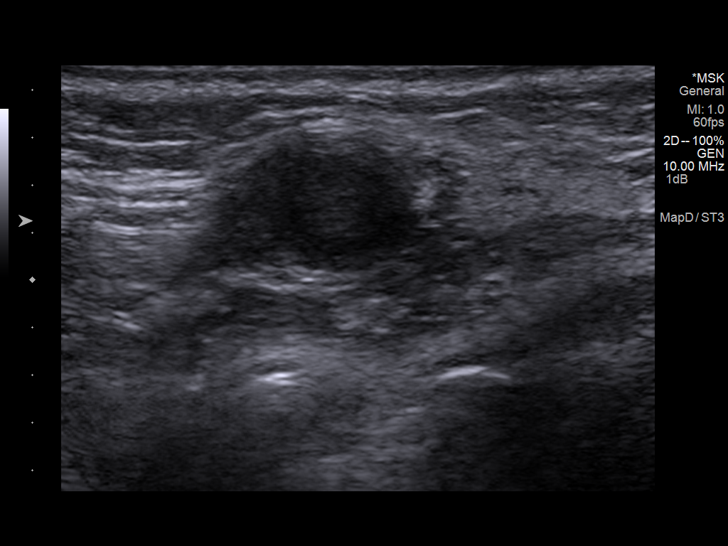
[im 7/7]
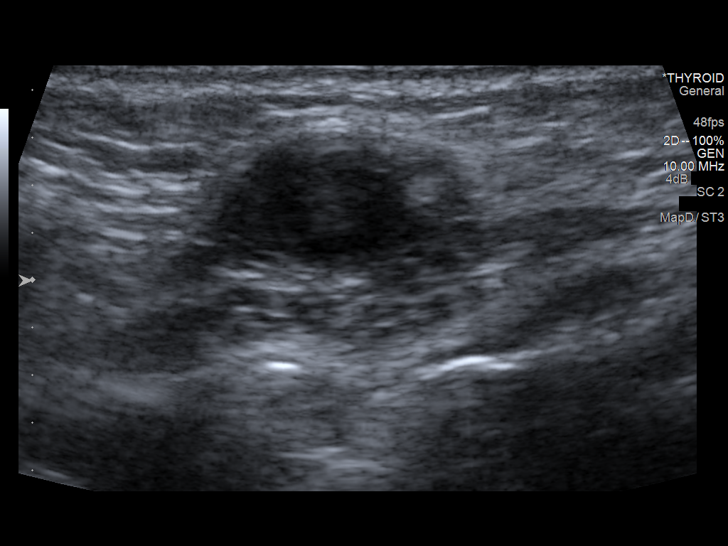

[14 of 21 positions shown; findings below may reference images not displayed]

FINDINGS: The palpable abnormality corresponds to a nonspecific subcutaneous
hypoechoic mass with internal vascularity within the left cheek
measuring 1.1 x 0.6 x 0.9 cm.
IMPRESSION: The palpable abnormality corresponds to a nonspecific vascular mass.

## 2015-12-29 DIAGNOSIS — H60509 Unspecified acute noninfective otitis externa, unspecified ear: Secondary | ICD-10-CM | POA: Diagnosis not present

## 2016-01-09 ENCOUNTER — Telehealth: Payer: Self-pay | Admitting: *Deleted

## 2016-01-09 DIAGNOSIS — M810 Age-related osteoporosis without current pathological fracture: Secondary | ICD-10-CM

## 2016-01-09 HISTORY — DX: Age-related osteoporosis without current pathological fracture: M81.0

## 2016-01-09 NOTE — Telephone Encounter (Signed)
Pt has a new grandbaby coming soon asked if there is any shots she should have done prior? Such as whooping cough? Please advise

## 2016-01-09 NOTE — Telephone Encounter (Signed)
She had T dap in 2014 that is a shot that needs to be done every 10 years, so does not need that at this time. Tell her her husband will need  T dap, can  get at primary care, and anyone in close contact with the baby should also have the T dap. Tell her congratulations on the new grandbaby.

## 2016-01-09 NOTE — Telephone Encounter (Signed)
Pt informed with the below note. 

## 2016-01-12 ENCOUNTER — Encounter: Payer: BLUE CROSS/BLUE SHIELD | Admitting: Women's Health

## 2016-01-31 ENCOUNTER — Ambulatory Visit (INDEPENDENT_AMBULATORY_CARE_PROVIDER_SITE_OTHER): Payer: BLUE CROSS/BLUE SHIELD | Admitting: Women's Health

## 2016-01-31 ENCOUNTER — Encounter: Payer: Self-pay | Admitting: Women's Health

## 2016-01-31 VITALS — BP 112/70 | Ht 63.0 in | Wt 148.0 lb

## 2016-01-31 DIAGNOSIS — Z13820 Encounter for screening for osteoporosis: Secondary | ICD-10-CM

## 2016-01-31 DIAGNOSIS — F411 Generalized anxiety disorder: Secondary | ICD-10-CM | POA: Diagnosis not present

## 2016-01-31 DIAGNOSIS — Z01419 Encounter for gynecological examination (general) (routine) without abnormal findings: Secondary | ICD-10-CM

## 2016-01-31 DIAGNOSIS — M810 Age-related osteoporosis without current pathological fracture: Secondary | ICD-10-CM

## 2016-01-31 MED ORDER — FLUOXETINE HCL 20 MG PO CAPS: 20.0000 mg | ORAL_CAPSULE | Freq: Every day | ORAL | 4 refills | Status: DC

## 2016-01-31 MED ORDER — ALENDRONATE SODIUM 70 MG PO TABS: 70.0000 mg | ORAL_TABLET | ORAL | 4 refills | Status: DC

## 2016-01-31 NOTE — Patient Instructions (Signed)
Dr Collene Mares  [phone]  Menopause is a normal process in which your reproductive ability comes to an end. This process happens gradually over a span of months to years, usually between the ages of 38 and 44. Menopause is complete when you have missed 12 consecutive menstrual periods. It is important to talk with your health care provider about some of the most common conditions that affect postmenopausal women, such as heart disease, cancer, and bone loss (osteoporosis). Adopting a healthy lifestyle and getting preventive care can help to promote your health and wellness. Those actions can also lower your chances of developing some of these common conditions. WHAT SHOULD I KNOW ABOUT MENOPAUSE? During menopause, you may experience a number of symptoms, such as:  Moderate-to-severe hot flashes.  Night sweats.  Decrease in sex drive.  Mood swings.  Headaches.  Tiredness.  Irritability.  Memory problems.  Insomnia. Choosing to treat or not to treat menopausal changes is an individual decision that you make with your health care provider. WHAT SHOULD I KNOW ABOUT HORMONE REPLACEMENT THERAPY AND SUPPLEMENTS? Hormone therapy products are effective for treating symptoms that are associated with menopause, such as hot flashes and night sweats. Hormone replacement carries certain risks, especially as you become older. If you are thinking about using estrogen or estrogen with progestin treatments, discuss the benefits and risks with your health care provider. WHAT SHOULD I KNOW ABOUT HEART DISEASE AND STROKE? Heart disease, heart attack, and stroke become more likely as you age. This may be due, in part, to the hormonal changes that your body experiences during menopause. These can affect how your body processes dietary fats, triglycerides, and cholesterol. Heart attack and stroke are both medical emergencies. There are many things that you can do to help prevent heart disease and stroke:  Have your  blood pressure checked at least every 1-2 years. High blood pressure causes heart disease and increases the risk of stroke.  If you are 82-98 years old, ask your health care provider if you should take aspirin to prevent a heart attack or a stroke.  Do not use any tobacco products, including cigarettes, chewing tobacco, or electronic cigarettes. If you need help quitting, ask your health care provider.  It is important to eat a healthy diet and maintain a healthy weight.  Be sure to include plenty of vegetables, fruits, low-fat dairy products, and lean protein.  Avoid eating foods that are high in solid fats, added sugars, or salt (sodium).  Get regular exercise. This is one of the most important things that you can do for your health.  Try to exercise for at least 150 minutes each week. The type of exercise that you do should increase your heart rate and make you sweat. This is known as moderate-intensity exercise.  Try to do strengthening exercises at least twice each week. Do these in addition to the moderate-intensity exercise.  Know your numbers.Ask your health care provider to check your cholesterol and your blood glucose. Continue to have your blood tested as directed by your health care provider. WHAT SHOULD I KNOW ABOUT CANCER SCREENING? There are several types of cancer. Take the following steps to reduce your risk and to catch any cancer development as early as possible. Breast Cancer  Practice breast self-awareness.  This means understanding how your breasts normally appear and feel.  It also means doing regular breast self-exams. Let your health care provider know about any changes, no matter how small.  If you are 40 or older,  have a clinician do a breast exam (clinical breast exam or CBE) every year. Depending on your age, family history, and medical history, it may be recommended that you also have a yearly breast X-ray (mammogram).  If you have a family history of  breast cancer, talk with your health care provider about genetic screening.  If you are at high risk for breast cancer, talk with your health care provider about having an MRI and a mammogram every year.  Breast cancer (BRCA) gene test is recommended for women who have family members with BRCA-related cancers. Results of the assessment will determine the need for genetic counseling and BRCA1 and for BRCA2 testing. BRCA-related cancers include these types:  Breast. This occurs in males or females.  Ovarian.  Tubal. This may also be called fallopian tube cancer.  Cancer of the abdominal or pelvic lining (peritoneal cancer).  Prostate.  Pancreatic. Cervical, Uterine, and Ovarian Cancer Your health care provider may recommend that you be screened regularly for cancer of the pelvic organs. These include your ovaries, uterus, and vagina. This screening involves a pelvic exam, which includes checking for microscopic changes to the surface of your cervix (Pap test).  For women ages 21-65, health care providers may recommend a pelvic exam and a Pap test every three years. For women ages 59-65, they may recommend the Pap test and pelvic exam, combined with testing for human papilloma virus (HPV), every five years. Some types of HPV increase your risk of cervical cancer. Testing for HPV may also be done on women of any age who have unclear Pap test results.  Other health care providers may not recommend any screening for nonpregnant women who are considered low risk for pelvic cancer and have no symptoms. Ask your health care provider if a screening pelvic exam is right for you.  If you have had past treatment for cervical cancer or a condition that could lead to cancer, you need Pap tests and screening for cancer for at least 20 years after your treatment. If Pap tests have been discontinued for you, your risk factors (such as having a new sexual partner) need to be reassessed to determine if you  should start having screenings again. Some women have medical problems that increase the chance of getting cervical cancer. In these cases, your health care provider may recommend that you have screening and Pap tests more often.  If you have a family history of uterine cancer or ovarian cancer, talk with your health care provider about genetic screening.  If you have vaginal bleeding after reaching menopause, tell your health care provider.  There are currently no reliable tests available to screen for ovarian cancer. Lung Cancer Lung cancer screening is recommended for adults 74-3 years old who are at high risk for lung cancer because of a history of smoking. A yearly low-dose CT scan of the lungs is recommended if you:  Currently smoke.  Have a history of at least 30 pack-years of smoking and you currently smoke or have quit within the past 15 years. A pack-year is smoking an average of one pack of cigarettes per day for one year. Yearly screening should:  Continue until it has been 15 years since you quit.  Stop if you develop a health problem that would prevent you from having lung cancer treatment. Colorectal Cancer  This type of cancer can be detected and can often be prevented.  Routine colorectal cancer screening usually begins at age 36 and continues through age  75.  If you have risk factors for colon cancer, your health care provider may recommend that you be screened at an earlier age.  If you have a family history of colorectal cancer, talk with your health care provider about genetic screening.  Your health care provider may also recommend using home test kits to check for hidden blood in your stool.  A small camera at the end of a tube can be used to examine your colon directly (sigmoidoscopy or colonoscopy). This is done to check for the earliest forms of colorectal cancer.  Direct examination of the colon should be repeated every 5-10 years until age 16. However, if  early forms of precancerous polyps or small growths are found or if you have a family history or genetic risk for colorectal cancer, you may need to be screened more often. Skin Cancer  Check your skin from head to toe regularly.  Monitor any moles. Be sure to tell your health care provider:  About any new moles or changes in moles, especially if there is a change in a mole's shape or color.  If you have a mole that is larger than the size of a pencil eraser.  If any of your family members has a history of skin cancer, especially at a young age, talk with your health care provider about genetic screening.  Always use sunscreen. Apply sunscreen liberally and repeatedly throughout the day.  Whenever you are outside, protect yourself by wearing long sleeves, pants, a wide-brimmed hat, and sunglasses. WHAT SHOULD I KNOW ABOUT OSTEOPOROSIS? Osteoporosis is a condition in which bone destruction happens more quickly than new bone creation. After menopause, you may be at an increased risk for osteoporosis. To help prevent osteoporosis or the bone fractures that can happen because of osteoporosis, the following is recommended:  If you are 73-74 years old, get at least 1,000 mg of calcium and at least 600 mg of vitamin D per day.  If you are older than age 68 but younger than age 6, get at least 1,200 mg of calcium and at least 600 mg of vitamin D per day.  If you are older than age 34, get at least 1,200 mg of calcium and at least 800 mg of vitamin D per day. Smoking and excessive alcohol intake increase the risk of osteoporosis. Eat foods that are rich in calcium and vitamin D, and do weight-bearing exercises several times each week as directed by your health care provider. WHAT SHOULD I KNOW ABOUT HOW MENOPAUSE AFFECTS Erika Davis? Depression may occur at any age, but it is more common as you become older. Common symptoms of depression include:  Low or sad mood.  Changes in sleep  patterns.  Changes in appetite or eating patterns.  Feeling an overall lack of motivation or enjoyment of activities that you previously enjoyed.  Frequent crying spells. Talk with your health care provider if you think that you are experiencing depression. WHAT SHOULD I KNOW ABOUT IMMUNIZATIONS? It is important that you get and maintain your immunizations. These include:  Tetanus, diphtheria, and pertussis (Tdap) booster vaccine.  Influenza every year before the flu season begins.  Pneumonia vaccine.  Shingles vaccine. Your health care provider may also recommend other immunizations.   This information is not intended to replace advice given to you by your health care provider. Make sure you discuss any questions you have with your health care provider.   Document Released: 07/19/2005 Document Revised: 06/17/2014 Document Reviewed: 01/27/2014 Elsevier  Interactive Patient Education [employer].

## 2016-01-31 NOTE — Progress Notes (Signed)
Marene LenzLeesa A Milliman 05/17/1956 [id number]    History:    Presents for annual exam.  Postmenopausal/no HRT/no bleeding. Normal Pap and mammogram history. 2015 T score -2.6 at right femoral neck all sites showed bone loss, Fosamax started  in 2015. 2008 negative colonoscopy. Anxiety and depression stable on Prozac would like to continue. Received Zostavax 2016. Primary care manages hypercholesterolemia.  Past medical history, past surgical history, family history and social history were all reviewed and documented in the EPIC chart. Retired from [employer]. Works 4 days a week at [employer]. Helps care for mother-in-law. Has grandchildren.  ROS:  A ROS was performed and pertinent positives and negatives are included.  Exam:  Vitals:   01/31/16 1053  BP: 112/70  Weight: 148 lb (67.1 kg)  Height: 5\' 3"  (1.6 m)   Body mass index is 26.22 kg/m.   General appearance:  Normal Thyroid:  Symmetrical, normal in size, without palpable masses or nodularity. Respiratory  Auscultation:  Clear without wheezing or rhonchi Cardiovascular  Auscultation:  Regular rate, without rubs, murmurs or gallops  Edema/varicosities:  Not grossly evident Abdominal  Soft,nontender, without masses, guarding or rebound.  Liver/spleen:  No organomegaly noted  Hernia:  None appreciated  Skin  Inspection:  Grossly normal   Breasts: Examined lying and sitting.     Right: Without masses, retractions, discharge or axillary adenopathy.     Left: Without masses, retractions, discharge or axillary adenopathy. Gentitourinary   Inguinal/mons:  Normal without inguinal adenopathy  External genitalia:  Normal  BUS/Urethra/Skene's glands:  Normal  Vagina:  Normal  Cervix:  Normal  Uterus:  normal in size, shape and contour.  Midline and mobile  Adnexa/parametria:     Rt: Without masses or tenderness.   Lt: Without masses or tenderness.  Anus and perineum: Normal  Digital rectal exam: Normal sphincter tone without  palpated masses or tenderness  Assessment/Plan:  60 y.o. MWF G2 P2 for annual exam with no complaints.  Postmenopausal/no HRT/no bleeding Osteoporosis on Fosamax tolerating well Anxiety and depression stable on Prozac Hypercholesterolemia-primary care manages labs and meds  Plan: Fosamax 70 prescription, proper use given and reviewed. Repeat DEXA. Home safety, fall prevention and importance of weightbearing exercise reviewed. Prilosec 20 mg by mouth daily prescription, proper use given and reviewed denies need for counseling doing well. Encourage self-care, leisure activities and regular exercise. SBE's, continue annual screening mammogram 3-D tomography reviewed and encouraged. UA, Pap normal with negative HR HPV 2016, new screening guidelines reviewed. Instructed to have primary care check vitamin D level with next blood draw   .Harrington ChallengerYOUNG,Errika Narvaiz J WHNP, 1:03 PM 01/31/2016

## 2016-02-01 LAB — URINALYSIS W MICROSCOPIC + REFLEX CULTURE
Bacteria, UA: NONE SEEN [HPF]
Bilirubin Urine: NEGATIVE
CASTS: NONE SEEN [LPF]
Crystals: NONE SEEN [HPF]
Glucose, UA: NEGATIVE
Hgb urine dipstick: NEGATIVE
Ketones, ur: NEGATIVE
NITRITE: NEGATIVE
PH: 6 (ref 5.0–8.0)
Protein, ur: NEGATIVE
SPECIFIC GRAVITY, URINE: 1.017 (ref 1.001–1.035)
Squamous Epithelial / LPF: NONE SEEN [HPF] (ref <=5)
YEAST: NONE SEEN [HPF]

## 2016-02-02 LAB — URINE CULTURE

## 2016-02-08 ENCOUNTER — Encounter: Payer: Self-pay | Admitting: Gynecology

## 2016-02-08 ENCOUNTER — Other Ambulatory Visit: Payer: Self-pay | Admitting: Gynecology

## 2016-02-08 ENCOUNTER — Ambulatory Visit (INDEPENDENT_AMBULATORY_CARE_PROVIDER_SITE_OTHER): Payer: BLUE CROSS/BLUE SHIELD

## 2016-02-08 DIAGNOSIS — M810 Age-related osteoporosis without current pathological fracture: Secondary | ICD-10-CM

## 2016-02-08 DIAGNOSIS — Z13820 Encounter for screening for osteoporosis: Secondary | ICD-10-CM

## 2016-02-19 ENCOUNTER — Other Ambulatory Visit: Payer: Self-pay

## 2016-02-19 DIAGNOSIS — M810 Age-related osteoporosis without current pathological fracture: Secondary | ICD-10-CM

## 2016-02-19 MED ORDER — ALENDRONATE SODIUM 70 MG PO TABS: 70.0000 mg | ORAL_TABLET | ORAL | 3 refills | Status: DC

## 2016-04-12 DIAGNOSIS — Z0000 Encounter for general adult medical examination without abnormal findings: Secondary | ICD-10-CM | POA: Diagnosis not present

## 2016-04-19 DIAGNOSIS — Z23 Encounter for immunization: Secondary | ICD-10-CM | POA: Diagnosis not present

## 2016-04-19 DIAGNOSIS — Z0000 Encounter for general adult medical examination without abnormal findings: Secondary | ICD-10-CM | POA: Diagnosis not present

## 2016-07-23 ENCOUNTER — Encounter: Payer: Self-pay | Admitting: Women's Health

## 2016-07-23 DIAGNOSIS — Z1231 Encounter for screening mammogram for malignant neoplasm of breast: Secondary | ICD-10-CM | POA: Diagnosis not present

## 2016-08-20 DIAGNOSIS — Z1211 Encounter for screening for malignant neoplasm of colon: Secondary | ICD-10-CM | POA: Diagnosis not present

## 2016-08-20 DIAGNOSIS — K648 Other hemorrhoids: Secondary | ICD-10-CM | POA: Diagnosis not present

## 2016-09-04 DIAGNOSIS — D122 Benign neoplasm of ascending colon: Secondary | ICD-10-CM | POA: Diagnosis not present

## 2016-09-04 DIAGNOSIS — Z1211 Encounter for screening for malignant neoplasm of colon: Secondary | ICD-10-CM | POA: Diagnosis not present

## 2016-09-04 DIAGNOSIS — K635 Polyp of colon: Secondary | ICD-10-CM | POA: Diagnosis not present

## 2016-12-20 ENCOUNTER — Telehealth: Payer: Self-pay | Admitting: *Deleted

## 2016-12-20 NOTE — Telephone Encounter (Signed)
Pt called requesting 90 day supply for Prozac 20 mg, states her refills are gone, her mother in law was not doing well and pt was taking 2 tablets daily. Pt annual scheduled on 02/03/17.

## 2016-12-22 ENCOUNTER — Other Ambulatory Visit: Payer: Self-pay | Admitting: Women's Health

## 2016-12-22 DIAGNOSIS — F411 Generalized anxiety disorder: Secondary | ICD-10-CM

## 2016-12-22 MED ORDER — FLUOXETINE HCL 20 MG PO CAPS: 20.0000 mg | ORAL_CAPSULE | Freq: Every day | ORAL | 0 refills | Status: DC

## 2016-12-22 NOTE — Telephone Encounter (Signed)
Ok for refill, I will escrbe

## 2016-12-24 ENCOUNTER — Other Ambulatory Visit: Payer: Self-pay | Admitting: Women's Health

## 2016-12-24 DIAGNOSIS — F411 Generalized anxiety disorder: Secondary | ICD-10-CM

## 2016-12-24 MED ORDER — FLUOXETINE HCL 20 MG PO CAPS: 20.0000 mg | ORAL_CAPSULE | Freq: Every day | ORAL | 0 refills | Status: DC

## 2017-01-14 ENCOUNTER — Other Ambulatory Visit: Payer: Self-pay

## 2017-01-14 DIAGNOSIS — F411 Generalized anxiety disorder: Secondary | ICD-10-CM

## 2017-01-15 ENCOUNTER — Other Ambulatory Visit: Payer: Self-pay

## 2017-01-15 DIAGNOSIS — F411 Generalized anxiety disorder: Secondary | ICD-10-CM

## 2017-01-15 MED ORDER — FLUOXETINE HCL 20 MG PO CAPS: 20.0000 mg | ORAL_CAPSULE | Freq: Every day | ORAL | 0 refills | Status: DC

## 2017-02-02 ENCOUNTER — Other Ambulatory Visit: Payer: Self-pay | Admitting: Women's Health

## 2017-02-02 DIAGNOSIS — F411 Generalized anxiety disorder: Secondary | ICD-10-CM

## 2017-02-03 ENCOUNTER — Encounter: Payer: Self-pay | Admitting: Women's Health

## 2017-02-03 ENCOUNTER — Ambulatory Visit (INDEPENDENT_AMBULATORY_CARE_PROVIDER_SITE_OTHER): Payer: BLUE CROSS/BLUE SHIELD | Admitting: Women's Health

## 2017-02-03 VITALS — BP 118/74 | Ht 63.0 in | Wt 151.0 lb

## 2017-02-03 DIAGNOSIS — Z01419 Encounter for gynecological examination (general) (routine) without abnormal findings: Secondary | ICD-10-CM

## 2017-02-03 DIAGNOSIS — F411 Generalized anxiety disorder: Secondary | ICD-10-CM

## 2017-02-03 DIAGNOSIS — R350 Frequency of micturition: Secondary | ICD-10-CM | POA: Diagnosis not present

## 2017-02-03 DIAGNOSIS — M810 Age-related osteoporosis without current pathological fracture: Secondary | ICD-10-CM

## 2017-02-03 MED ORDER — TRAZODONE HCL 50 MG PO TABS: 50.0000 mg | ORAL_TABLET | Freq: Two times a day (BID) | ORAL | 1 refills | Status: DC | PRN

## 2017-02-03 MED ORDER — ALENDRONATE SODIUM 70 MG PO TABS: 70.0000 mg | ORAL_TABLET | ORAL | 3 refills | Status: DC

## 2017-02-03 MED ORDER — FLUOXETINE HCL 20 MG PO CAPS: 20.0000 mg | ORAL_CAPSULE | Freq: Every day | ORAL | 4 refills | Status: DC

## 2017-02-03 NOTE — Addendum Note (Signed)
Addended by: Rushie Goltz on: 02/03/2017 11:54 AM   Modules accepted: Orders

## 2017-02-03 NOTE — Progress Notes (Signed)
Erika Davis [birth year] [id number]    History:    Presents for annual exam.  Postmenopausal on no HRT with no bleeding. Normal Pap and mammogram history. Osteoporosis on Fosamax since 2015, 2017 DEXA showed improvement at all sites. 2018 benign colon polyp 5 year follow-up. Primary care manages hypercholesterolemia. Anxiety and depression stable on Prozac 20 mg. 2016 Zostavax.  Past medical history, past surgical history, family history and social history were all reviewed and documented in the EPIC chart. Retired from [employer]. Works part-time at [employer]. Helps care for her mother-in-law who has dementia. Husband prostatectomy doing well, able to have intercourse. 4 grandchildren.  ROS:  A ROS was performed and pertinent positives and negatives are included.  Exam:  Vitals:   02/03/17 0933  BP: 118/74  Weight: 151 lb (68.5 kg)  Height: 5\' 3"  (1.6 m)   Body mass index is 26.75 kg/m.   General appearance:  Normal Thyroid:  Symmetrical, normal in size, without palpable masses or nodularity. Respiratory  Auscultation:  Clear without wheezing or rhonchi Cardiovascular  Auscultation:  Regular rate, without rubs, murmurs or gallops  Edema/varicosities:  Not grossly evident Abdominal  Soft,nontender, without masses, guarding or rebound.  Liver/spleen:  No organomegaly noted  Hernia:  None appreciated  Skin  Inspection:  Grossly normal   Breasts: Examined lying and sitting.     Right: Without masses, retractions, discharge or axillary adenopathy.     Left: Without masses, retractions, discharge or axillary adenopathy. Gentitourinary   Inguinal/mons:  Normal without inguinal adenopathy  External genitalia:  Normal  BUS/Urethra/Skene's glands:  Normal  Vagina:  Normal  Cervix:  Normal  Uterus:   normal in size, shape and contour.  Midline and mobile  Adnexa/parametria:     Rt: Without masses or tenderness.   Lt: Without masses or tenderness.  Anus and  perineum: Normal  Digital rectal exam: Normal sphincter tone without palpated masses or tenderness  Assessment/Plan:  61 y.o. MWF G3 P2 for annual exam with complaint of occasional urinary urgency/frequency without pain or burning.  Postmenopausal on no HRT with no bleeding. Hypercholesteremia-primary care manages Anxiety and depression stable on Prozac Osteoporosis improvement on Fosamax  Plan: Fosamax 70 mg by mouth daily proper administration reviewed will repeat DEXA next year. Home safety, fall prevention and importance of weightbearing exercise reviewed. Aware call for therapy is to take Fosamax for 5 years. Prozac 20 mg by mouth daily prescription, proper use given and reviewed. Trazodone 50 mg when necessary for sleep, sleep hygiene reviewed. Reviewed importance of no napping. Reviewed addictive properties and not to take daily. SBE's, continue annual screening mammogram, exercise, calcium rich diet, vitamin D 2000 daily encouraged. Pap normal 2016, new screening guidelines reviewed. UA pending.  Harrington Challenger [facility], 10:43 AM 02/03/2017

## 2017-02-03 NOTE — Patient Instructions (Signed)
Health Maintenance for Postmenopausal Women Menopause is a normal process in which your reproductive ability comes to an end. This process happens gradually over a span of months to years, usually between the ages of 22 and 9. Menopause is complete when you have missed 12 consecutive menstrual periods. It is important to talk with your health care provider about some of the most common conditions that affect postmenopausal women, such as heart disease, cancer, and bone loss (osteoporosis). Adopting a healthy lifestyle and getting preventive care can help to promote your health and wellness. Those actions can also lower your chances of developing some of these common conditions. What should I know about menopause? During menopause, you may experience a number of symptoms, such as:  Moderate-to-severe hot flashes.  Night sweats.  Decrease in sex drive.  Mood swings.  Headaches.  Tiredness.  Irritability.  Memory problems.  Insomnia.  Choosing to treat or not to treat menopausal changes is an individual decision that you make with your health care provider. What should I know about hormone replacement therapy and supplements? Hormone therapy products are effective for treating symptoms that are associated with menopause, such as hot flashes and night sweats. Hormone replacement carries certain risks, especially as you become older. If you are thinking about using estrogen or estrogen with progestin treatments, discuss the benefits and risks with your health care provider. What should I know about heart disease and stroke? Heart disease, heart attack, and stroke become more likely as you age. This may be due, in part, to the hormonal changes that your body experiences during menopause. These can affect how your body processes dietary fats, triglycerides, and cholesterol. Heart attack and stroke are both medical emergencies. There are many things that you can do to help prevent heart disease  and stroke:  Have your blood pressure checked at least every 1-2 years. High blood pressure causes heart disease and increases the risk of stroke.  If you are 53-22 years old, ask your health care provider if you should take aspirin to prevent a heart attack or a stroke.  Do not use any tobacco products, including cigarettes, chewing tobacco, or electronic cigarettes. If you need help quitting, ask your health care provider.  It is important to eat a healthy diet and maintain a healthy weight. ? Be sure to include plenty of vegetables, fruits, low-fat dairy products, and lean protein. ? Avoid eating foods that are high in solid fats, added sugars, or salt (sodium).  Get regular exercise. This is one of the most important things that you can do for your health. ? Try to exercise for at least 150 minutes each week. The type of exercise that you do should increase your heart rate and make you sweat. This is known as moderate-intensity exercise. ? Try to do strengthening exercises at least twice each week. Do these in addition to the moderate-intensity exercise.  Know your numbers.Ask your health care provider to check your cholesterol and your blood glucose. Continue to have your blood tested as directed by your health care provider.  What should I know about cancer screening? There are several types of cancer. Take the following steps to reduce your risk and to catch any cancer development as early as possible. Breast Cancer  Practice breast self-awareness. ? This means understanding how your breasts normally appear and feel. ? It also means doing regular breast self-exams. Let your health care provider know about any changes, no matter how small.  If you are 40  or older, have a clinician do a breast exam (clinical breast exam or CBE) every year. Depending on your age, family history, and medical history, it may be recommended that you also have a yearly breast X-ray (mammogram).  If you  have a family history of breast cancer, talk with your health care provider about genetic screening.  If you are at high risk for breast cancer, talk with your health care provider about having an MRI and a mammogram every year.  Breast cancer (BRCA) gene test is recommended for women who have family members with BRCA-related cancers. Results of the assessment will determine the need for genetic counseling and BRCA1 and for BRCA2 testing. BRCA-related cancers include these types: ? Breast. This occurs in males or females. ? Ovarian. ? Tubal. This may also be called fallopian tube cancer. ? Cancer of the abdominal or pelvic lining (peritoneal cancer). ? Prostate. ? Pancreatic.  Cervical, Uterine, and Ovarian Cancer Your health care provider may recommend that you be screened regularly for cancer of the pelvic organs. These include your ovaries, uterus, and vagina. This screening involves a pelvic exam, which includes checking for microscopic changes to the surface of your cervix (Pap test).  For women ages 21-65, health care providers may recommend a pelvic exam and a Pap test every three years. For women ages 79-65, they may recommend the Pap test and pelvic exam, combined with testing for human papilloma virus (HPV), every five years. Some types of HPV increase your risk of cervical cancer. Testing for HPV may also be done on women of any age who have unclear Pap test results.  Other health care providers may not recommend any screening for nonpregnant women who are considered low risk for pelvic cancer and have no symptoms. Ask your health care provider if a screening pelvic exam is right for you.  If you have had past treatment for cervical cancer or a condition that could lead to cancer, you need Pap tests and screening for cancer for at least 20 years after your treatment. If Pap tests have been discontinued for you, your risk factors (such as having a new sexual partner) need to be  reassessed to determine if you should start having screenings again. Some women have medical problems that increase the chance of getting cervical cancer. In these cases, your health care provider may recommend that you have screening and Pap tests more often.  If you have a family history of uterine cancer or ovarian cancer, talk with your health care provider about genetic screening.  If you have vaginal bleeding after reaching menopause, tell your health care provider.  There are currently no reliable tests available to screen for ovarian cancer.  Lung Cancer Lung cancer screening is recommended for adults 69-62 years old who are at high risk for lung cancer because of a history of smoking. A yearly low-dose CT scan of the lungs is recommended if you:  Currently smoke.  Have a history of at least 30 pack-years of smoking and you currently smoke or have quit within the past 15 years. A pack-year is smoking an average of one pack of cigarettes per day for one year.  Yearly screening should:  Continue until it has been 15 years since you quit.  Stop if you develop a health problem that would prevent you from having lung cancer treatment.  Colorectal Cancer  This type of cancer can be detected and can often be prevented.  Routine colorectal cancer screening usually begins at  age 42 and continues through age 45.  If you have risk factors for colon cancer, your health care provider may recommend that you be screened at an earlier age.  If you have a family history of colorectal cancer, talk with your health care provider about genetic screening.  Your health care provider may also recommend using home test kits to check for hidden blood in your stool.  A small camera at the end of a tube can be used to examine your colon directly (sigmoidoscopy or colonoscopy). This is done to check for the earliest forms of colorectal cancer.  Direct examination of the colon should be repeated every  5-10 years until age 71. However, if early forms of precancerous polyps or small growths are found or if you have a family history or genetic risk for colorectal cancer, you may need to be screened more often.  Skin Cancer  Check your skin from head to toe regularly.  Monitor any moles. Be sure to tell your health care provider: ? About any new moles or changes in moles, especially if there is a change in a mole's shape or color. ? If you have a mole that is larger than the size of a pencil eraser.  If any of your family members has a history of skin cancer, especially at a young age, talk with your health care provider about genetic screening.  Always use sunscreen. Apply sunscreen liberally and repeatedly throughout the day.  Whenever you are outside, protect yourself by wearing long sleeves, pants, a wide-brimmed hat, and sunglasses.  What should I know about osteoporosis? Osteoporosis is a condition in which bone destruction happens more quickly than new bone creation. After menopause, you may be at an increased risk for osteoporosis. To help prevent osteoporosis or the bone fractures that can happen because of osteoporosis, the following is recommended:  If you are 46-71 years old, get at least 1,000 mg of calcium and at least 600 mg of vitamin D per day.  If you are older than age 55 but younger than age 65, get at least 1,200 mg of calcium and at least 600 mg of vitamin D per day.  If you are older than age 54, get at least 1,200 mg of calcium and at least 800 mg of vitamin D per day.  Smoking and excessive alcohol intake increase the risk of osteoporosis. Eat foods that are rich in calcium and vitamin D, and do weight-bearing exercises several times each week as directed by your health care provider. What should I know about how menopause affects my mental health? Depression may occur at any age, but it is more common as you become older. Common symptoms of depression  include:  Low or sad mood.  Changes in sleep patterns.  Changes in appetite or eating patterns.  Feeling an overall lack of motivation or enjoyment of activities that you previously enjoyed.  Frequent crying spells.  Talk with your health care provider if you think that you are experiencing depression. What should I know about immunizations? It is important that you get and maintain your immunizations. These include:  Tetanus, diphtheria, and pertussis (Tdap) booster vaccine.  Influenza every year before the flu season begins.  Pneumonia vaccine.  Shingles vaccine.  Your health care provider may also recommend other immunizations. This information is not intended to replace advice given to you by your health care provider. Make sure you discuss any questions you have with your health care provider. Document Released: 07/19/2005  Document Revised: 12/15/2015 Document Reviewed: 02/28/2015 Elsevier Interactive Patient Education  2018 [employer]vier Inc.  

## 2017-02-19 DIAGNOSIS — Z23 Encounter for immunization: Secondary | ICD-10-CM | POA: Diagnosis not present

## 2017-02-19 DIAGNOSIS — R002 Palpitations: Secondary | ICD-10-CM | POA: Diagnosis not present

## 2017-04-15 ENCOUNTER — Other Ambulatory Visit: Payer: Self-pay

## 2017-04-15 DIAGNOSIS — M810 Age-related osteoporosis without current pathological fracture: Secondary | ICD-10-CM

## 2017-04-15 MED ORDER — ALENDRONATE SODIUM 70 MG PO TABS: 70.0000 mg | ORAL_TABLET | ORAL | 2 refills | Status: DC

## 2017-05-12 DIAGNOSIS — E785 Hyperlipidemia, unspecified: Secondary | ICD-10-CM | POA: Diagnosis not present

## 2017-05-12 DIAGNOSIS — Z0000 Encounter for general adult medical examination without abnormal findings: Secondary | ICD-10-CM | POA: Diagnosis not present

## 2017-05-12 DIAGNOSIS — M810 Age-related osteoporosis without current pathological fracture: Secondary | ICD-10-CM | POA: Diagnosis not present

## 2017-05-15 DIAGNOSIS — Z0000 Encounter for general adult medical examination without abnormal findings: Secondary | ICD-10-CM | POA: Diagnosis not present

## 2017-05-15 DIAGNOSIS — R8290 Unspecified abnormal findings in urine: Secondary | ICD-10-CM | POA: Diagnosis not present

## 2017-06-06 DIAGNOSIS — J4521 Mild intermittent asthma with (acute) exacerbation: Secondary | ICD-10-CM | POA: Diagnosis not present

## 2017-06-06 DIAGNOSIS — J069 Acute upper respiratory infection, unspecified: Secondary | ICD-10-CM | POA: Diagnosis not present

## 2017-06-10 HISTORY — PX: OTHER SURGICAL HISTORY: SHX169

## 2017-07-10 DIAGNOSIS — F338 Other recurrent depressive disorders: Secondary | ICD-10-CM | POA: Diagnosis not present

## 2017-07-10 DIAGNOSIS — J4520 Mild intermittent asthma, uncomplicated: Secondary | ICD-10-CM | POA: Diagnosis not present

## 2017-07-25 DIAGNOSIS — Z1231 Encounter for screening mammogram for malignant neoplasm of breast: Secondary | ICD-10-CM | POA: Diagnosis not present

## 2017-12-23 ENCOUNTER — Other Ambulatory Visit: Payer: Self-pay

## 2017-12-23 DIAGNOSIS — M810 Age-related osteoporosis without current pathological fracture: Secondary | ICD-10-CM

## 2017-12-23 MED ORDER — ALENDRONATE SODIUM 70 MG PO TABS: 70.0000 mg | ORAL_TABLET | ORAL | 0 refills | Status: DC

## 2018-02-04 ENCOUNTER — Encounter: Payer: Self-pay | Admitting: Women's Health

## 2018-02-04 ENCOUNTER — Ambulatory Visit (INDEPENDENT_AMBULATORY_CARE_PROVIDER_SITE_OTHER): Payer: BLUE CROSS/BLUE SHIELD | Admitting: Women's Health

## 2018-02-04 VITALS — BP 110/80 | Ht 63.0 in | Wt 140.0 lb

## 2018-02-04 DIAGNOSIS — M810 Age-related osteoporosis without current pathological fracture: Secondary | ICD-10-CM

## 2018-02-04 DIAGNOSIS — F411 Generalized anxiety disorder: Secondary | ICD-10-CM

## 2018-02-04 DIAGNOSIS — Z01419 Encounter for gynecological examination (general) (routine) without abnormal findings: Secondary | ICD-10-CM

## 2018-02-04 MED ORDER — ALENDRONATE SODIUM 70 MG PO TABS: 70.0000 mg | ORAL_TABLET | ORAL | 4 refills | Status: DC

## 2018-02-04 MED ORDER — TRAZODONE HCL 50 MG PO TABS: 50.0000 mg | ORAL_TABLET | Freq: Two times a day (BID) | ORAL | 1 refills | Status: DC | PRN

## 2018-02-04 NOTE — Progress Notes (Signed)
Erika LenzLeesa A Davis 11/28/1955 [id number]    History:    Presents for annual exam.  Postmenopausal on no HRT with no bleeding.  Husband prostatectomy infrequent intercourse.  Normal Pap and mammogram history.  2018 benign colon polyp 5-year follow-up.  01/2016 T score -2.7 at spine -1.8 at hip has been on Fosamax since 2015.  Primary care manages anxiety/ depression and hypercholesteremia.  Occasional trazodone for insomnia.  Has lost 15 pounds in the past year with daily walking and dietary changes.  Past medical history, past surgical history, family history and social history were all reviewed and documented in the EPIC chart.  Works part-time at [employer].  Has 4 grandchildren all doing well.  2 children both well.Marland Kitchen. Helped care for her mother-in-law in the past with dementia passed away several months ago peacefully.  ROS:  A ROS was performed and pertinent positives and negatives are included.  Exam:  Vitals:   02/04/18 0921  BP: 110/80  Weight: 140 lb (63.5 kg)  Height: 5\' 3"  (1.6 m)   Body mass index is 24.8 kg/m.   General appearance:  Normal Thyroid:  Symmetrical, normal in size, without palpable masses or nodularity. Respiratory  Auscultation:  Clear without wheezing or rhonchi Cardiovascular  Auscultation:  Regular rate, without rubs, murmurs or gallops  Edema/varicosities:  Not grossly evident Abdominal  Soft,nontender, without masses, guarding or rebound.  Liver/spleen:  No organomegaly noted  Hernia:  None appreciated  Skin  Inspection:  Grossly normal   Breasts: Examined lying and sitting.     Right: Without masses, retractions, discharge or axillary adenopathy.     Left: Without masses, retractions, discharge or axillary adenopathy. Gentitourinary   Inguinal/mons:  Normal without inguinal adenopathy  External genitalia:  Normal  BUS/Urethra/Skene's glands:  Normal  Vagina:  Normal  Cervix:  Normal  Uterus:   normal in size, shape and contour.  Midline and  mobile  Adnexa/parametria:     Rt: Without masses or tenderness.   Lt: Without masses or tenderness.  Anus and perineum: Normal  Digital rectal exam: Normal sphincter tone without palpated masses or tenderness  Assessment/Plan:  [age] y.o. MWF G3, P2 for annual exam no complaints.  Postmenopausal/no HRT/no bleeding Osteoporosis on Fosamax with no fractures Anxiety/depression, hypercholesteremia-primary care manages labs and meds  Plan: Congratulations given regarding 15 pound weight loss in the past year with diet and exercise.  Encouraged to continue healthy lifestyle.  SBE's, annual screening mammogram, calcium rich foods, vitamin D 2000 daily encouraged.  Repeat DEXA instructed to schedule.  Home safety, fall prevention and importance of weightbearing and balance exercise reviewed.  Currently walking daily encouraged to add yoga.  Fosamax 70 mg weekly prescription, proper use given and reviewed proper administration.  Trazodone 50 mg prescription, proper use given and reviewed.  Reviewed importance of using sparingly, addictive properties reviewed.  Pap with HR HPV typing, new screening guidelines reviewed.   Harrington Challengerancy J Destinee Taber [facility] Service[facility]NP, 9:25 AM 02/04/2018

## 2018-02-04 NOTE — Patient Instructions (Signed)
Health Maintenance for Postmenopausal Women Menopause is a normal process in which your reproductive ability comes to an end. This process happens gradually over a span of months to years, usually between the ages of 22 and 9. Menopause is complete when you have missed 12 consecutive menstrual periods. It is important to talk with your health care provider about some of the most common conditions that affect postmenopausal women, such as heart disease, cancer, and bone loss (osteoporosis). Adopting a healthy lifestyle and getting preventive care can help to promote your health and wellness. Those actions can also lower your chances of developing some of these common conditions. What should I know about menopause? During menopause, you may experience a number of symptoms, such as:  Moderate-to-severe hot flashes.  Night sweats.  Decrease in sex drive.  Mood swings.  Headaches.  Tiredness.  Irritability.  Memory problems.  Insomnia.  Choosing to treat or not to treat menopausal changes is an individual decision that you make with your health care provider. What should I know about hormone replacement therapy and supplements? Hormone therapy products are effective for treating symptoms that are associated with menopause, such as hot flashes and night sweats. Hormone replacement carries certain risks, especially as you become older. If you are thinking about using estrogen or estrogen with progestin treatments, discuss the benefits and risks with your health care provider. What should I know about heart disease and stroke? Heart disease, heart attack, and stroke become more likely as you age. This may be due, in part, to the hormonal changes that your body experiences during menopause. These can affect how your body processes dietary fats, triglycerides, and cholesterol. Heart attack and stroke are both medical emergencies. There are many things that you can do to help prevent heart disease  and stroke:  Have your blood pressure checked at least every 1-2 years. High blood pressure causes heart disease and increases the risk of stroke.  If you are 53-22 years old, ask your health care provider if you should take aspirin to prevent a heart attack or a stroke.  Do not use any tobacco products, including cigarettes, chewing tobacco, or electronic cigarettes. If you need help quitting, ask your health care provider.  It is important to eat a healthy diet and maintain a healthy weight. ? Be sure to include plenty of vegetables, fruits, low-fat dairy products, and lean protein. ? Avoid eating foods that are high in solid fats, added sugars, or salt (sodium).  Get regular exercise. This is one of the most important things that you can do for your health. ? Try to exercise for at least 150 minutes each week. The type of exercise that you do should increase your heart rate and make you sweat. This is known as moderate-intensity exercise. ? Try to do strengthening exercises at least twice each week. Do these in addition to the moderate-intensity exercise.  Know your numbers.Ask your health care provider to check your cholesterol and your blood glucose. Continue to have your blood tested as directed by your health care provider.  What should I know about cancer screening? There are several types of cancer. Take the following steps to reduce your risk and to catch any cancer development as early as possible. Breast Cancer  Practice breast self-awareness. ? This means understanding how your breasts normally appear and feel. ? It also means doing regular breast self-exams. Let your health care provider know about any changes, no matter how small.  If you are 40  or older, have a clinician do a breast exam (clinical breast exam or CBE) every year. Depending on your age, family history, and medical history, it may be recommended that you also have a yearly breast X-ray (mammogram).  If you  have a family history of breast cancer, talk with your health care provider about genetic screening.  If you are at high risk for breast cancer, talk with your health care provider about having an MRI and a mammogram every year.  Breast cancer (BRCA) gene test is recommended for women who have family members with BRCA-related cancers. Results of the assessment will determine the need for genetic counseling and BRCA1 and for BRCA2 testing. BRCA-related cancers include these types: ? Breast. This occurs in males or females. ? Ovarian. ? Tubal. This may also be called fallopian tube cancer. ? Cancer of the abdominal or pelvic lining (peritoneal cancer). ? Prostate. ? Pancreatic.  Cervical, Uterine, and Ovarian Cancer Your health care provider may recommend that you be screened regularly for cancer of the pelvic organs. These include your ovaries, uterus, and vagina. This screening involves a pelvic exam, which includes checking for microscopic changes to the surface of your cervix (Pap test).  For women ages 21-65, health care providers may recommend a pelvic exam and a Pap test every three years. For women ages 79-65, they may recommend the Pap test and pelvic exam, combined with testing for human papilloma virus (HPV), every five years. Some types of HPV increase your risk of cervical cancer. Testing for HPV may also be done on women of any age who have unclear Pap test results.  Other health care providers may not recommend any screening for nonpregnant women who are considered low risk for pelvic cancer and have no symptoms. Ask your health care provider if a screening pelvic exam is right for you.  If you have had past treatment for cervical cancer or a condition that could lead to cancer, you need Pap tests and screening for cancer for at least 20 years after your treatment. If Pap tests have been discontinued for you, your risk factors (such as having a new sexual partner) need to be  reassessed to determine if you should start having screenings again. Some women have medical problems that increase the chance of getting cervical cancer. In these cases, your health care provider may recommend that you have screening and Pap tests more often.  If you have a family history of uterine cancer or ovarian cancer, talk with your health care provider about genetic screening.  If you have vaginal bleeding after reaching menopause, tell your health care provider.  There are currently no reliable tests available to screen for ovarian cancer.  Lung Cancer Lung cancer screening is recommended for adults 69-62 years old who are at high risk for lung cancer because of a history of smoking. A yearly low-dose CT scan of the lungs is recommended if you:  Currently smoke.  Have a history of at least 30 pack-years of smoking and you currently smoke or have quit within the past 15 years. A pack-year is smoking an average of one pack of cigarettes per day for one year.  Yearly screening should:  Continue until it has been 15 years since you quit.  Stop if you develop a health problem that would prevent you from having lung cancer treatment.  Colorectal Cancer  This type of cancer can be detected and can often be prevented.  Routine colorectal cancer screening usually begins at  age 42 and continues through age 45.  If you have risk factors for colon cancer, your health care provider may recommend that you be screened at an earlier age.  If you have a family history of colorectal cancer, talk with your health care provider about genetic screening.  Your health care provider may also recommend using home test kits to check for hidden blood in your stool.  A small camera at the end of a tube can be used to examine your colon directly (sigmoidoscopy or colonoscopy). This is done to check for the earliest forms of colorectal cancer.  Direct examination of the colon should be repeated every  5-10 years until age 71. However, if early forms of precancerous polyps or small growths are found or if you have a family history or genetic risk for colorectal cancer, you may need to be screened more often.  Skin Cancer  Check your skin from head to toe regularly.  Monitor any moles. Be sure to tell your health care provider: ? About any new moles or changes in moles, especially if there is a change in a mole's shape or color. ? If you have a mole that is larger than the size of a pencil eraser.  If any of your family members has a history of skin cancer, especially at a young age, talk with your health care provider about genetic screening.  Always use sunscreen. Apply sunscreen liberally and repeatedly throughout the day.  Whenever you are outside, protect yourself by wearing long sleeves, pants, a wide-brimmed hat, and sunglasses.  What should I know about osteoporosis? Osteoporosis is a condition in which bone destruction happens more quickly than new bone creation. After menopause, you may be at an increased risk for osteoporosis. To help prevent osteoporosis or the bone fractures that can happen because of osteoporosis, the following is recommended:  If you are 46-71 years old, get at least 1,000 mg of calcium and at least 600 mg of vitamin D per day.  If you are older than age 55 but younger than age 65, get at least 1,200 mg of calcium and at least 600 mg of vitamin D per day.  If you are older than age 54, get at least 1,200 mg of calcium and at least 800 mg of vitamin D per day.  Smoking and excessive alcohol intake increase the risk of osteoporosis. Eat foods that are rich in calcium and vitamin D, and do weight-bearing exercises several times each week as directed by your health care provider. What should I know about how menopause affects my mental health? Depression may occur at any age, but it is more common as you become older. Common symptoms of depression  include:  Low or sad mood.  Changes in sleep patterns.  Changes in appetite or eating patterns.  Feeling an overall lack of motivation or enjoyment of activities that you previously enjoyed.  Frequent crying spells.  Talk with your health care provider if you think that you are experiencing depression. What should I know about immunizations? It is important that you get and maintain your immunizations. These include:  Tetanus, diphtheria, and pertussis (Tdap) booster vaccine.  Influenza every year before the flu season begins.  Pneumonia vaccine.  Shingles vaccine.  Your health care provider may also recommend other immunizations. This information is not intended to replace advice given to you by your health care provider. Make sure you discuss any questions you have with your health care provider. Document Released: 07/19/2005  Document Revised: 12/15/2015 Document Reviewed: 02/28/2015 Elsevier Interactive Patient Education  2018 [employer]vier Inc.  

## 2018-02-06 LAB — PAP, TP IMAGING W/ HPV RNA, RFLX HPV TYPE 16,18/45: HPV DNA High Risk: NOT DETECTED

## 2018-02-10 ENCOUNTER — Other Ambulatory Visit: Payer: Self-pay | Admitting: Gynecology

## 2018-02-10 ENCOUNTER — Ambulatory Visit (INDEPENDENT_AMBULATORY_CARE_PROVIDER_SITE_OTHER): Payer: BLUE CROSS/BLUE SHIELD

## 2018-02-10 ENCOUNTER — Encounter: Payer: Self-pay | Admitting: Gynecology

## 2018-02-10 DIAGNOSIS — M810 Age-related osteoporosis without current pathological fracture: Secondary | ICD-10-CM | POA: Diagnosis not present

## 2018-02-16 ENCOUNTER — Other Ambulatory Visit: Payer: Self-pay | Admitting: Women's Health

## 2018-02-16 DIAGNOSIS — F411 Generalized anxiety disorder: Secondary | ICD-10-CM

## 2018-02-16 NOTE — Telephone Encounter (Signed)
Erika Davis, I do see in historical meds where you have been prescribing this for her since 2014. She got Rx last August x 1 year.  However ,no mention in your CE note this year in Aug 2019.

## 2018-02-16 NOTE — Telephone Encounter (Signed)
Must have forgotten, please refill thanks

## 2018-02-20 DIAGNOSIS — M25521 Pain in right elbow: Secondary | ICD-10-CM | POA: Diagnosis not present

## 2018-03-17 ENCOUNTER — Other Ambulatory Visit: Payer: Self-pay | Admitting: Women's Health

## 2018-03-17 DIAGNOSIS — M810 Age-related osteoporosis without current pathological fracture: Secondary | ICD-10-CM

## 2018-03-24 ENCOUNTER — Other Ambulatory Visit: Payer: Self-pay | Admitting: *Deleted

## 2018-03-24 DIAGNOSIS — F411 Generalized anxiety disorder: Secondary | ICD-10-CM

## 2018-03-24 MED ORDER — TRAZODONE HCL 50 MG PO TABS: 50.0000 mg | ORAL_TABLET | Freq: Two times a day (BID) | ORAL | 1 refills | Status: DC | PRN

## 2018-03-24 NOTE — Telephone Encounter (Signed)
Okay for refill, reviewed importance of not taking daily, addictive 

## 2018-04-17 DIAGNOSIS — M79644 Pain in right finger(s): Secondary | ICD-10-CM | POA: Diagnosis not present

## 2018-04-17 DIAGNOSIS — M7711 Lateral epicondylitis, right elbow: Secondary | ICD-10-CM | POA: Diagnosis not present

## 2018-04-21 DIAGNOSIS — M1811 Unilateral primary osteoarthritis of first carpometacarpal joint, right hand: Secondary | ICD-10-CM | POA: Diagnosis not present

## 2018-04-28 DIAGNOSIS — M1811 Unilateral primary osteoarthritis of first carpometacarpal joint, right hand: Secondary | ICD-10-CM | POA: Diagnosis not present

## 2018-05-10 DIAGNOSIS — M1811 Unilateral primary osteoarthritis of first carpometacarpal joint, right hand: Secondary | ICD-10-CM

## 2018-05-10 HISTORY — DX: Unilateral primary osteoarthritis of first carpometacarpal joint, right hand: M18.11

## 2018-05-19 DIAGNOSIS — M1811 Unilateral primary osteoarthritis of first carpometacarpal joint, right hand: Secondary | ICD-10-CM | POA: Diagnosis not present

## 2018-05-20 DIAGNOSIS — E785 Hyperlipidemia, unspecified: Secondary | ICD-10-CM | POA: Diagnosis not present

## 2018-05-20 DIAGNOSIS — Z0000 Encounter for general adult medical examination without abnormal findings: Secondary | ICD-10-CM | POA: Diagnosis not present

## 2018-05-25 ENCOUNTER — Other Ambulatory Visit: Payer: Self-pay | Admitting: Orthopedic Surgery

## 2018-05-25 ENCOUNTER — Encounter (HOSPITAL_BASED_OUTPATIENT_CLINIC_OR_DEPARTMENT_OTHER): Payer: Self-pay | Admitting: *Deleted

## 2018-05-25 ENCOUNTER — Other Ambulatory Visit: Payer: Self-pay

## 2018-05-28 NOTE — H&P (Signed)
Erika LenzLeesa A Davis is an 62 y.o. female.   CC / Reason for Visit: Right thumb pain HPI: This patient returns reevaluation, having undergone right TMC injection on 04-28-18, indicating that she has improved in some ways.  The sharp pains are now often gone, but the throbbing is still present at nighttime, causing her to ice it at nighttime.  She is still interested in proceeding with operative options, hopefully before the end of the year.  HPI 04-28-18: This patient is a 62 year old retired female who presents for evaluation of pain at the base of the right thumb.  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  She has used a variety of braces, not 1 of which seems to be the cool comfort splint or something similar.  She has a forearm-based thumb spica splint present.  She also reports that she has had non-guided injections, the last of which was November 8.  She reports that this injection was not helpful in alleviating pain.  It has been recommended by her report that she undergo thumb suspension arthroplasty, and she is interested in other variations, including hemi-trapeziectomy and implant options.  Past Medical History:  Diagnosis Date  . Arthritis   . Asthma   . 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 History of colon polyps 2008    HYPERPLASTIC-BENIGN  . Osteoporosis 02/2018   T score -2.0 improved on Fosamax from prior DEXA 2015 T score -2.6  . Raynaud phenomenon    "fingers with poorer circulation at times"    Past Surgical History:  Procedure Laterality Date  . BLADDER SURGERY    . BLADDER SUSPENSION    . EYE SURGERY  1985  . TUBAL LIGATION  1980    Family History  Problem Relation Age of Onset  . Cancer Mother        UTERINE  . Prostate cancer Father   . Cancer Maternal Grandmother        Lung  . Stroke Maternal Grandfather    Social History:  reports that she has never smoked. She has never used smokeless  tobacco. She reports current alcohol use of about 8.0 standard drinks of alcohol per week. She reports that she does not use drugs.  Allergies: No Known Allergies  No medications prior to admission.    No results found for this or any previous visit (from the past 48 hour(s)). No results found.  Review of Systems  All other systems reviewed and are negative.   Height 5' 2.5" (1.588 m), weight 64.4 kg. Physical Exam  Constitutional:  WD, WN, NAD HEENT:  NCAT, EOMI Neuro/Psych:  Alert & oriented to person, place, and time; appropriate mood & affect Lymphatic: No generalized UE edema or lymphadenopathy Extremities / MSK:  Both UE are normal with respect to appearance, ranges of motion, joint stability, muscle strength/tone, sensation, & perfusion except as otherwise noted:  The right thumb is enlarged at its base with a positive shoulder sign.  There is pain with crepitus with stress and grind testing.  The MP flexes to 70, IP to 75, with 18 of hyperextension at the MP joint.  Stable to varus and valgus applied stress.  Key pinch: Right 8/left 10 and tip pinch: Right 4/left 8, all painful on the right  Labs / X-rays:  Right TMC series, ordered and obtained today, reveals advanced degenerative change at the [facility], with relatively little change around the rest of the trapezium.  There is 50% plus subluxation bilaterally, with large supporting osteophytes having been formed at the radial distal aspect of the trapezium, and significant osteophytes on the ulnar side of the trapezium as well.  Assessment: Right thumb TMC osteoarthritis, with fairly significant changes of the TMC joint without significant pantrapezial arthritis, but with some arthritic change between the trapezium and second metacarpal  Plan:  We discussed these findings, and indicated that I would consult with some of my colleagues, particularly one who has greater volume of experience with the Stablyx implant to gain  additional input regarding the wisdom of attempting the Stablyx procedure in her clinical setting.  I am leaning against it myself, in favor of a traditional thumb suspension arthroplasty (LRTI).  I will call her regarding the input so we can decide how best to proceed.  Jodi Marbleavid A Ashonti Leandro, MD 05/28/2018, 11:10 PM

## 2018-06-01 ENCOUNTER — Ambulatory Visit (HOSPITAL_BASED_OUTPATIENT_CLINIC_OR_DEPARTMENT_OTHER)
Admission: RE | Admit: 2018-06-01 | Discharge: 2018-06-01 | Disposition: A | Discharge status: Home / Self Care | Payer: BLUE CROSS/BLUE SHIELD | Attending: Orthopedic Surgery | Admitting: Orthopedic Surgery

## 2018-06-01 ENCOUNTER — Other Ambulatory Visit: Payer: Self-pay

## 2018-06-01 ENCOUNTER — Ambulatory Visit (HOSPITAL_BASED_OUTPATIENT_CLINIC_OR_DEPARTMENT_OTHER): Payer: BLUE CROSS/BLUE SHIELD | Admitting: Anesthesiology

## 2018-06-01 ENCOUNTER — Encounter (HOSPITAL_BASED_OUTPATIENT_CLINIC_OR_DEPARTMENT_OTHER)
Admission: RE | Disposition: A | Discharge status: Home / Self Care | Payer: Self-pay | Source: Home / Self Care | Attending: Orthopedic Surgery

## 2018-06-01 ENCOUNTER — Encounter (HOSPITAL_BASED_OUTPATIENT_CLINIC_OR_DEPARTMENT_OTHER): Payer: Self-pay

## 2018-06-01 DIAGNOSIS — G8918 Other acute postprocedural pain: Secondary | ICD-10-CM | POA: Diagnosis not present

## 2018-06-01 DIAGNOSIS — M24841 Other specific joint derangements of right hand, not elsewhere classified: Secondary | ICD-10-CM | POA: Diagnosis not present

## 2018-06-01 DIAGNOSIS — F418 Other specified anxiety disorders: Secondary | ICD-10-CM | POA: Diagnosis not present

## 2018-06-01 DIAGNOSIS — M1811 Unilateral primary osteoarthritis of first carpometacarpal joint, right hand: Secondary | ICD-10-CM | POA: Diagnosis not present

## 2018-06-01 DIAGNOSIS — M19041 Primary osteoarthritis, right hand: Secondary | ICD-10-CM | POA: Diagnosis not present

## 2018-06-01 DIAGNOSIS — M79644 Pain in right finger(s): Secondary | ICD-10-CM | POA: Diagnosis not present

## 2018-06-01 HISTORY — DX: Unilateral primary osteoarthritis of first carpometacarpal joint, right hand: M18.11

## 2018-06-01 HISTORY — PX: CARPOMETACARPEL SUSPENSION PLASTY: SHX5005

## 2018-06-01 HISTORY — DX: Raynaud's syndrome without gangrene: I73.00

## 2018-06-01 SURGERY — CARPOMETACARPEL (CMC) SUSPENSION PLASTY
Anesthesia: General | Site: Hand | Laterality: Right

## 2018-06-01 MED ORDER — FENTANYL CITRATE (PF) 100 MCG/2ML IJ SOLN
50.0000 ug | INTRAMUSCULAR | Status: DC | PRN
  Administered 2018-06-01: 50 ug via INTRAVENOUS

## 2018-06-01 MED ORDER — 0.9 % SODIUM CHLORIDE (POUR BTL) OPTIME
TOPICAL | Status: DC | PRN
  Administered 2018-06-01: 1000 mL

## 2018-06-01 MED ORDER — OXYCODONE HCL 5 MG PO TABS: 5.0000 mg | ORAL_TABLET | Freq: Four times a day (QID) | ORAL | 0 refills | Status: DC | PRN

## 2018-06-01 MED ORDER — LIDOCAINE 2% (20 MG/ML) 5 ML SYRINGE
INTRAMUSCULAR | Status: AC
  Filled 2018-06-01: qty 5

## 2018-06-01 MED ORDER — PROPOFOL 10 MG/ML IV BOLUS
INTRAVENOUS | Status: DC | PRN
  Administered 2018-06-01: 130 mg via INTRAVENOUS

## 2018-06-01 MED ORDER — LACTATED RINGERS IV SOLN
INTRAVENOUS | Status: DC
  Administered 2018-06-01: 09:00:00 via INTRAVENOUS

## 2018-06-01 MED ORDER — DEXAMETHASONE SODIUM PHOSPHATE 10 MG/ML IJ SOLN
INTRAMUSCULAR | Status: DC | PRN
  Administered 2018-06-01: 10 mg via INTRAVENOUS

## 2018-06-01 MED ORDER — FENTANYL CITRATE (PF) 100 MCG/2ML IJ SOLN
INTRAMUSCULAR | Status: AC
  Filled 2018-06-01: qty 2

## 2018-06-01 MED ORDER — IBUPROFEN 200 MG PO TABS: 600.0000 mg | ORAL_TABLET | Freq: Four times a day (QID) | ORAL | 0 refills | Status: DC

## 2018-06-01 MED ORDER — MIDAZOLAM HCL 2 MG/2ML IJ SOLN
INTRAMUSCULAR | Status: AC
  Filled 2018-06-01: qty 2

## 2018-06-01 MED ORDER — PROMETHAZINE HCL 25 MG/ML IJ SOLN: 6.2500 mg | INTRAMUSCULAR | Status: DC | PRN

## 2018-06-01 MED ORDER — BUPIVACAINE-EPINEPHRINE (PF) 0.5% -1:200000 IJ SOLN
INTRAMUSCULAR | Status: DC | PRN
  Administered 2018-06-01: 30 mL via PERINEURAL

## 2018-06-01 MED ORDER — CHLORHEXIDINE GLUCONATE 4 % EX LIQD: 60.0000 mL | Freq: Once | CUTANEOUS | Status: DC

## 2018-06-01 MED ORDER — MIDAZOLAM HCL 2 MG/2ML IJ SOLN
1.0000 mg | INTRAMUSCULAR | Status: DC | PRN
  Administered 2018-06-01: 2 mg via INTRAVENOUS

## 2018-06-01 MED ORDER — CEFAZOLIN SODIUM-DEXTROSE 2-4 GM/100ML-% IV SOLN
INTRAVENOUS | Status: AC
  Filled 2018-06-01: qty 100

## 2018-06-01 MED ORDER — FENTANYL CITRATE (PF) 100 MCG/2ML IJ SOLN: 25.0000 ug | INTRAMUSCULAR | Status: DC | PRN

## 2018-06-01 MED ORDER — ACETAMINOPHEN 325 MG PO TABS: 650.0000 mg | ORAL_TABLET | Freq: Four times a day (QID) | ORAL | Status: DC

## 2018-06-01 MED ORDER — PHENYLEPHRINE 40 MCG/ML (10ML) SYRINGE FOR IV PUSH (FOR BLOOD PRESSURE SUPPORT)
PREFILLED_SYRINGE | INTRAVENOUS | Status: DC | PRN
  Administered 2018-06-01: 80 ug via INTRAVENOUS

## 2018-06-01 MED ORDER — CEFAZOLIN SODIUM-DEXTROSE 2-4 GM/100ML-% IV SOLN
2.0000 g | INTRAVENOUS | Status: AC
  Administered 2018-06-01: 2 g via INTRAVENOUS

## 2018-06-01 MED ORDER — ONDANSETRON HCL 4 MG/2ML IJ SOLN
INTRAMUSCULAR | Status: DC | PRN
  Administered 2018-06-01: 4 mg via INTRAVENOUS

## 2018-06-01 MED ORDER — SCOPOLAMINE 1 MG/3DAYS TD PT72: 1.0000 | MEDICATED_PATCH | Freq: Once | TRANSDERMAL | Status: DC | PRN

## 2018-06-01 SURGICAL SUPPLY — 60 items
BIT DRILL 11/64XX180123XX4 (BIT)
BIT DRILL 11/64XX180123XX4.3 (BIT) IMPLANT
BIT DRILL 4.8MMDIAX5IN DISPOSE (BIT) ×1 IMPLANT
BIT DRILL 5/64X5 DISP (BIT) ×2 IMPLANT
BLADE AVERAGE 25X9 (BLADE) ×2 IMPLANT
BLADE MINI RND TIP GREEN BEAV (BLADE) IMPLANT
BLADE SURG 15 STRL LF DISP TIS (BLADE) ×1 IMPLANT
BLADE SURG 15 STRL SS (BLADE) ×1
BNDG COHESIVE 4X5 TAN STRL (GAUZE/BANDAGES/DRESSINGS) ×2 IMPLANT
BNDG ESMARK 4X9 LF (GAUZE/BANDAGES/DRESSINGS) ×2 IMPLANT
BNDG GAUZE ELAST 4 BULKY (GAUZE/BANDAGES/DRESSINGS) ×2 IMPLANT
CHLORAPREP W/TINT 26ML (MISCELLANEOUS) ×2 IMPLANT
CORD BIPOLAR FORCEPS 12FT (ELECTRODE) ×2 IMPLANT
COVER BACK TABLE 60X90IN (DRAPES) ×2 IMPLANT
COVER MAYO STAND STRL (DRAPES) ×2 IMPLANT
COVER WAND RF STERILE (DRAPES) IMPLANT
CUFF TOURNIQUET SINGLE 18IN (TOURNIQUET CUFF) IMPLANT
DECANTER SPIKE VIAL GLASS SM (MISCELLANEOUS) IMPLANT
DRAPE EXTREMITY T 121X128X90 (DRAPE) ×2 IMPLANT
DRAPE SURG 17X23 STRL (DRAPES) ×2 IMPLANT
DRILL BIT 1/8DIAX5INL DISPOSE (BIT) IMPLANT
DRILL BIT 11/64XX180123XX4.3 (BIT)
DRILL BIT 4.8MMDIAX5IN DISPOSE (BIT) ×2
DRSG EMULSION OIL 3X3 NADH (GAUZE/BANDAGES/DRESSINGS) ×2 IMPLANT
GAUZE SPONGE 4X4 12PLY STRL LF (GAUZE/BANDAGES/DRESSINGS) ×2 IMPLANT
GLOVE BIO SURGEON STRL SZ7.5 (GLOVE) ×2 IMPLANT
GLOVE BIOGEL PI IND STRL 7.0 (GLOVE) ×1 IMPLANT
GLOVE BIOGEL PI IND STRL 8 (GLOVE) ×1 IMPLANT
GLOVE BIOGEL PI INDICATOR 7.0 (GLOVE) ×1
GLOVE BIOGEL PI INDICATOR 8 (GLOVE) ×1
GLOVE ECLIPSE 6.5 STRL STRAW (GLOVE) ×2 IMPLANT
GOWN STRL REUS W/ TWL LRG LVL3 (GOWN DISPOSABLE) ×2 IMPLANT
GOWN STRL REUS W/TWL LRG LVL3 (GOWN DISPOSABLE) ×2
GOWN STRL REUS W/TWL XL LVL3 (GOWN DISPOSABLE) ×2 IMPLANT
K-WIRE .062X4 (WIRE) ×2 IMPLANT
LOOP VESSEL MAXI BLUE (MISCELLANEOUS) IMPLANT
LOOP VESSEL MINI RED (MISCELLANEOUS) IMPLANT
NEEDLE HYPO 25X1 1.5 SAFETY (NEEDLE) IMPLANT
NS IRRIG 1000ML POUR BTL (IV SOLUTION) ×2 IMPLANT
PACK BASIN DAY SURGERY FS (CUSTOM PROCEDURE TRAY) ×2 IMPLANT
PADDING CAST ABS 4INX4YD NS (CAST SUPPLIES) ×1
PADDING CAST ABS COTTON 4X4 ST (CAST SUPPLIES) ×1 IMPLANT
SLEEVE SCD COMPRESS KNEE MED (MISCELLANEOUS) ×2 IMPLANT
SLING ARM FOAM STRAP LRG (SOFTGOODS) ×2 IMPLANT
SPLINT PLASTER CAST XFAST 3X15 (CAST SUPPLIES) IMPLANT
SPLINT PLASTER XTRA FASTSET 3X (CAST SUPPLIES)
STOCKINETTE 6  STRL (DRAPES) ×1
STOCKINETTE 6 STRL (DRAPES) ×1 IMPLANT
SUCTION FRAZIER HANDLE 10FR (MISCELLANEOUS) ×1
SUCTION TUBE FRAZIER 10FR DISP (MISCELLANEOUS) ×1 IMPLANT
SUT ETHIBOND 2-0 V-5 NEEDLE (SUTURE) IMPLANT
SUT ETHIBOND 3-0 V-5 (SUTURE) ×2 IMPLANT
SUT STEEL 4 (SUTURE) ×2 IMPLANT
SUT VICRYL RAPIDE 4-0 (SUTURE) IMPLANT
SUT VICRYL RAPIDE 4/0 PS 2 (SUTURE) ×2 IMPLANT
SYR 10ML LL (SYRINGE) IMPLANT
SYR BULB 3OZ (MISCELLANEOUS) ×2 IMPLANT
TOWEL GREEN STERILE FF (TOWEL DISPOSABLE) ×2 IMPLANT
TUBE CONNECTING 20X1/4 (TUBING) ×2 IMPLANT
UNDERPAD 30X30 (UNDERPADS AND DIAPERS) ×2 IMPLANT

## 2018-06-01 NOTE — Interval H&P Note (Signed)
History and Physical Interval Note:  06/01/2018 9:24 AM  Erika LenzLeesa A Davis  has presented today for surgery, with the diagnosis of RIGHT THUMB ARTHRITIS  The various methods of treatment have been discussed with the patient and family. After consideration of risks, benefits and other options for treatment, the patient has consented to  Procedure(s): RIGHT THUMB CARPOMETACARPEL (CMC) SUSPENSION PLASTY (Right) as a surgical intervention .  The patient's history has been reviewed, patient examined, no change in status, stable for surgery.  I have reviewed the patient's chart and labs.  Questions were answered to the patient's satisfaction.     Jodi Marbleavid A Cherica Heiden

## 2018-06-01 NOTE — Progress Notes (Signed)
Assisted Dr. Rob Fitzgerald with right, ultrasound guided, supraclavicular block. Side rails up, monitors on throughout procedure. See vital signs in flow sheet. Tolerated Procedure well. 

## 2018-06-01 NOTE — Anesthesia Procedure Notes (Signed)
Procedure Name: LMA Insertion Date/Time: 06/01/2018 11:27 AM Performed by: Burna Cashonrad, Avonna Iribe C, CRNA Pre-anesthesia Checklist: Patient identified, Emergency Drugs available, Suction available and Patient being monitored Patient Re-evaluated:Patient Re-evaluated prior to induction Oxygen Delivery Method: Circle system utilized Preoxygenation: Pre-oxygenation with 100% oxygen Induction Type: IV induction Ventilation: Mask ventilation without difficulty LMA: LMA inserted LMA Size: 4.0 Number of attempts: 1 Airway Equipment and Method: Bite block Placement Confirmation: positive ETCO2 Tube secured with: Tape Dental Injury: Teeth and Oropharynx as per pre-operative assessment

## 2018-06-01 NOTE — Transfer of Care (Signed)
Immediate Anesthesia Transfer of Care Note  Patient: Marene LenzLeesa A Farver  Procedure(s) Performed: RIGHT THUMB CARPOMETACARPEL (CMC) SUSPENSION PLASTY (Right Hand)  Patient Location: PACU  Anesthesia Type:GA combined with regional for post-op pain  Level of Consciousness: awake, alert , oriented and patient cooperative  Airway & Oxygen Therapy: Patient Spontanous Breathing and Patient connected to nasal cannula oxygen  Post-op Assessment: Report given to RN, Post -op Vital signs reviewed and stable and Patient moving all extremities  Post vital signs: Reviewed and stable  Last Vitals:  Vitals Value Taken Time  BP 103/68 06/01/2018 12:22 PM  Temp    Pulse 64 06/01/2018 12:23 PM  Resp 16 06/01/2018 12:23 PM  SpO2 93 % 06/01/2018 12:23 PM  Vitals shown include unvalidated device data.  Last Pain:  Vitals:   06/01/18 0834  TempSrc: Oral         Complications: No apparent anesthesia complications

## 2018-06-01 NOTE — Anesthesia Procedure Notes (Signed)
Anesthesia Regional Block: Axillary brachial plexus block   Pre-Anesthetic Checklist: ,, timeout performed, Correct Patient, Correct Site, Correct Laterality, Correct Procedure, Correct Position, site marked, Risks and benefits discussed,  Surgical consent,  Pre-op evaluation,  At surgeon's request and post-op pain management  Laterality: Right  Prep: chloraprep       Needles:  Injection technique: Single-shot  Needle Type: Echogenic Stimulator Needle     Needle Length: 9cm  Needle Gauge: 21     Additional Needles:   Procedures:, nerve stimulator,,, ultrasound used (permanent image in chart),,,,   Nerve Stimulator or Paresthesia:  Response: MC, radial, median, ulnar responses elicited, 0.5 mA,   Additional Responses:   Narrative:  Start time: 06/01/2018 10:04 AM End time: 06/01/2018 10:11 AM Injection made incrementally with aspirations every 5 mL.  Performed by: Personally  Anesthesiologist: Marcene DuosFitzgerald, Tameka Hoiland, MD

## 2018-06-01 NOTE — Discharge Instructions (Signed)
Discharge Instructions   You have a dressing with a plaster splint incorporated in it. Move your fingers as much as possible, making a full fist and fully opening the fist. Elevate your hand to reduce pain & swelling of the digits.  Ice over the operative site may be helpful to reduce pain & swelling.  DO NOT USE HEAT. Pain medicine has been prescribed for you.  Take Tylenol 650 mg and ibuprofen 600 mg every 6 hours together. Take the Oxycodone 5 mg as a rescue medicine for severe pain. Leave the dressing in place until you return to our office.  You may shower, but keep the bandage clean & dry.  You may drive a car when you are off of prescription pain medications and can safely control your vehicle with both hands. Our office will call you to arrange follow-up   Please call [phone] during normal business hours or [phone] after hours for any problems. Including the following:  - excessive redness of the incisions - drainage for more than 4 days - fever of more than 101.5 F  *Please note that pain medications will not be refilled after hours or on weekends.  No work with right hand.    Post Anesthesia Home Care Instructions  Activity: Get plenty of rest for the remainder of the day. A responsible individual must stay with you for 24 hours following the procedure.  For the next 24 hours, DO NOT: -Drive a car -Advertising copywriterperate machinery -Drink alcoholic beverages -Take any medication unless instructed by your physician -Make any legal decisions or sign important papers.  Meals: Start with liquid foods such as gelatin or soup. Progress to regular foods as tolerated. Avoid greasy, spicy, heavy foods. If nausea and/or vomiting occur, drink only clear liquids until the nausea and/or vomiting subsides. Call your physician if vomiting continues.  Special Instructions/Symptoms: Your throat may feel dry or sore from the anesthesia or the breathing tube placed in your throat during  surgery. If this causes discomfort, gargle with warm salt water. The discomfort should disappear within 24 hours.  If you had a scopolamine patch placed behind your ear for the management of post- operative nausea and/or vomiting:  1. The medication in the patch is effective for 72 hours, after which it should be removed.  Wrap patch in a tissue and discard in the trash. Wash hands thoroughly with soap and water. 2. You may remove the patch earlier than 72 hours if you experience unpleasant side effects which may include dry mouth, dizziness or visual disturbances. 3. Avoid touching the patch. Wash your hands with soap and water after contact with the patch.

## 2018-06-01 NOTE — Interval H&P Note (Signed)
History and Physical Interval Note:  06/01/2018 9:27 AM  Erika Davis  has presented today for surgery, with the diagnosis of RIGHT THUMB ARTHRITIS  The various methods of treatment have been discussed with the patient and family. After consideration of risks, benefits and other options for treatment, the patient has consented to  Procedure(s): RIGHT THUMB CARPOMETACARPEL (CMC) SUSPENSION PLASTY (Right) as a surgical intervention .  The patient's history has been reviewed, patient examined, no change in status, stable for surgery.  I have reviewed the patient's chart and labs.  Questions were answered to the patient's satisfaction.     Jodi Marbleavid A Brycelynn Stampley

## 2018-06-01 NOTE — Anesthesia Preprocedure Evaluation (Addendum)
Anesthesia Evaluation  Patient identified by MRN, date of birth, ID band Patient awake    Reviewed: Allergy & Precautions, NPO status , Patient's Chart, lab work & pertinent test results  Airway Mallampati: II  TM Distance: >3 FB Neck ROM: Full    Dental  (+) Dental Advisory Given   Pulmonary asthma ,    breath sounds clear to auscultation       Cardiovascular negative cardio ROS   Rhythm:Regular Rate:Normal     Neuro/Psych negative neurological ROS     GI/Hepatic negative GI ROS, Neg liver ROS,   Endo/Other  negative endocrine ROS  Renal/GU negative Renal ROS     Musculoskeletal  (+) Arthritis ,   Abdominal   Peds  Hematology negative hematology ROS (+)   Anesthesia Other Findings   Reproductive/Obstetrics                             Anesthesia Physical Anesthesia Plan  ASA: II  Anesthesia Plan: General   Post-op Pain Management:  Regional for Post-op pain   Induction: Intravenous  PONV Risk Score and Plan: 3 and Midazolam, Dexamethasone, Ondansetron and Treatment may vary due to age or medical condition  Airway Management Planned: LMA  Additional Equipment:   Intra-op Plan:   Post-operative Plan: Extubation in OR  Informed Consent: I have reviewed the patients History and Physical, chart, labs and discussed the procedure including the risks, benefits and alternatives for the proposed anesthesia with the patient or authorized representative who has indicated his/her understanding and acceptance.   Dental advisory given  Plan Discussed with: CRNA  Anesthesia Plan Comments:         Anesthesia Quick Evaluation

## 2018-06-01 NOTE — Anesthesia Postprocedure Evaluation (Signed)
Anesthesia Post Note  Patient: Marene LenzLeesa A Heal  Procedure(s) Performed: RIGHT THUMB CARPOMETACARPEL (CMC) SUSPENSION PLASTY (Right Hand)     Patient location during evaluation: PACU Anesthesia Type: General Level of consciousness: awake and alert Pain management: pain level controlled Vital Signs Assessment: post-procedure vital signs reviewed and stable Respiratory status: spontaneous breathing, nonlabored ventilation, respiratory function stable and patient connected to nasal cannula oxygen Cardiovascular status: blood pressure returned to baseline and stable Postop Assessment: no apparent nausea or vomiting Anesthetic complications: no    Last Vitals:  Vitals:   06/01/18 1230 06/01/18 1245  BP: 102/67   Pulse: 65 (!) 49  Resp: 16 11  Temp:    SpO2: 98% 98%    Last Pain:  Vitals:   06/01/18 1245  TempSrc:   PainSc: 0-No pain                 Kennieth RadFitzgerald, Breylin Dom E

## 2018-06-01 NOTE — Op Note (Signed)
06/01/2018  9:29 AM  PATIENT:  Erika LenzLeesa A Davis  62 y.o. female  PRE-OPERATIVE DIAGNOSIS:  Right TMC osteoarthritis  POST-OPERATIVE DIAGNOSIS:  Same  PROCEDURE:  Right thumb suspension arthroplasty  SURGEON: Cliffton Astersavid A. Janee Mornhompson, MD  PHYSICIAN ASSISTANT: Danielle RankinKirsten Schrader, OPA-C  ANESTHESIA:  regional and general  SPECIMENS:  None, trapezium fragments discarded   DRAINS:   None  EBL:  less than 50 mL  PREOPERATIVE INDICATIONS:  Erika LenzLeesa A Davis is a  62 y.o. female with right TMC osteoarthritis, having failed non-operative management.   The risks benefits and alternatives were discussed with the patient preoperatively including but not limited to the risks of infection, bleeding, nerve injury, cardiopulmonary complications, the need for revision surgery, among others, and the patient verbalized understanding and consented to proceed.  OPERATIVE IMPLANTS: none  OPERATIVE PROCEDURE:  After receiving prophylactic antibiotics and a regional block, the patient was escorted to the operative theatre and placed in a supine position.  General anesthesia was administered.  A surgical "time-out" was performed during which the planned procedure, proposed operative site, and the correct patient identity were compared to the operative consent and agreement confirmed by the circulating nurse according to current facility policy.  Following application of a tourniquet to the operative extremity, the exposed skin was prepped with Chloraprep and draped in the usual sterile fashion.  The limb was exsanguinated with an Esmarch bandage and the tourniquet inflated to approximately 100mmHg higher than systolic BP.  A wagoner curvilinear incision was marked over the base of the thumb and made sharply with a scalpel, subcutaneous tissues were dissected with a combination of blunt and spreading dissection.  The APL had a broad insertion, some of which was on the trapezium.  I split the APL between the insertions on  the trapezium and then on the metacarpal.  The capsule was incised and reflected.  [facility] badly [facility] and decision was made to proceed.  The trapezium was excised piecemeal.  The scaphoid trapezoid joint was inspected and found to be sufficiently healthy not to perform a proximal hemi-trapezoid ectomy.  The base of the metacarpal was removed with an oscillating saw, protecting the FCR.  A hole was then made in the dorsal cortex of the metacarpal to allow for passage of the graft, exiting near the volar corner.  The graft was then harvested through a separate proximal incision directly over the FCR in standard fashion, as a longitudinal half strip of distally based FCR.  This was brought through the tunnel, back down to the dorsal capsule and tied in a half hitch upon itself which was secured with 3-0 Ethibond suture.  It was also secured to the dorsal periosteum with the same type suture.  The remainder of the tendon used as a spacer was then fan folded on a suture that had been placed into the depths of the wound, and the resection space was irrigated copiously.  The tendon was then secured into the base, tying over all of the tendon and insertional material, securing it into the depths of the wound to prevent dislocation.  The tension for the tendon was set with the thumb neither compressed nor distracted.  The thumb was stable, with good position and alignment at this point.  The dorsal capsule was closed with 3-0 Ethibond suture and the APL split was reapproximated.  Tourniquet was released, some additional hemostasis obtained with bipolar electrocautery and then the skin was closed with 4-0 Vicryl Rapide running horizontal mattress suture  to both places.  A short arm thumb spica splint dressing was applied and she was taken to the recovery room in stable condition, breathing spontaneously.  DISPOSITION: She will be discharged home today with typical instructions, returning in 10 to 15 days with  follow-on hand therapy appointment for custom forearm-based thumb spica splint fabrication and initiation of rehab.

## 2018-06-04 ENCOUNTER — Encounter (HOSPITAL_BASED_OUTPATIENT_CLINIC_OR_DEPARTMENT_OTHER): Payer: Self-pay | Admitting: Orthopedic Surgery

## 2018-06-18 DIAGNOSIS — M1811 Unilateral primary osteoarthritis of first carpometacarpal joint, right hand: Secondary | ICD-10-CM | POA: Diagnosis not present

## 2018-06-23 DIAGNOSIS — M1811 Unilateral primary osteoarthritis of first carpometacarpal joint, right hand: Secondary | ICD-10-CM | POA: Diagnosis not present

## 2018-07-16 DIAGNOSIS — M1811 Unilateral primary osteoarthritis of first carpometacarpal joint, right hand: Secondary | ICD-10-CM | POA: Diagnosis not present

## 2018-07-18 ENCOUNTER — Other Ambulatory Visit: Payer: Self-pay | Admitting: Women's Health

## 2018-07-18 DIAGNOSIS — F411 Generalized anxiety disorder: Secondary | ICD-10-CM

## 2018-07-20 NOTE — Telephone Encounter (Signed)
Rx called in to pharmacy. 

## 2018-07-20 NOTE — Telephone Encounter (Signed)
OK for refill.

## 2018-07-28 DIAGNOSIS — M1811 Unilateral primary osteoarthritis of first carpometacarpal joint, right hand: Secondary | ICD-10-CM | POA: Diagnosis not present

## 2018-07-31 ENCOUNTER — Encounter: Payer: Self-pay | Admitting: Women's Health

## 2018-07-31 DIAGNOSIS — Z1231 Encounter for screening mammogram for malignant neoplasm of breast: Secondary | ICD-10-CM | POA: Diagnosis not present

## 2018-08-10 DIAGNOSIS — M1811 Unilateral primary osteoarthritis of first carpometacarpal joint, right hand: Secondary | ICD-10-CM | POA: Diagnosis not present

## 2018-08-17 DIAGNOSIS — M1811 Unilateral primary osteoarthritis of first carpometacarpal joint, right hand: Secondary | ICD-10-CM | POA: Diagnosis not present

## 2018-08-24 DIAGNOSIS — M1811 Unilateral primary osteoarthritis of first carpometacarpal joint, right hand: Secondary | ICD-10-CM | POA: Diagnosis not present

## 2018-08-26 DIAGNOSIS — M1811 Unilateral primary osteoarthritis of first carpometacarpal joint, right hand: Secondary | ICD-10-CM | POA: Diagnosis not present

## 2018-08-31 DIAGNOSIS — M1811 Unilateral primary osteoarthritis of first carpometacarpal joint, right hand: Secondary | ICD-10-CM | POA: Diagnosis not present

## 2018-11-17 ENCOUNTER — Other Ambulatory Visit: Payer: Self-pay | Admitting: Women's Health

## 2018-11-17 DIAGNOSIS — M810 Age-related osteoporosis without current pathological fracture: Secondary | ICD-10-CM

## 2018-11-26 DIAGNOSIS — M65331 Trigger finger, right middle finger: Secondary | ICD-10-CM | POA: Diagnosis not present

## 2018-11-26 DIAGNOSIS — M1811 Unilateral primary osteoarthritis of first carpometacarpal joint, right hand: Secondary | ICD-10-CM | POA: Diagnosis not present

## 2019-01-31 ENCOUNTER — Other Ambulatory Visit: Payer: Self-pay | Admitting: Women's Health

## 2019-01-31 DIAGNOSIS — M810 Age-related osteoporosis without current pathological fracture: Secondary | ICD-10-CM

## 2019-02-08 ENCOUNTER — Ambulatory Visit (INDEPENDENT_AMBULATORY_CARE_PROVIDER_SITE_OTHER): Payer: BC Managed Care – PPO | Admitting: Women's Health

## 2019-02-08 ENCOUNTER — Encounter: Payer: Self-pay | Admitting: Women's Health

## 2019-02-08 ENCOUNTER — Other Ambulatory Visit: Payer: Self-pay

## 2019-02-08 VITALS — BP 124/80 | Ht 63.0 in | Wt 143.0 lb

## 2019-02-08 DIAGNOSIS — Z01419 Encounter for gynecological examination (general) (routine) without abnormal findings: Secondary | ICD-10-CM | POA: Diagnosis not present

## 2019-02-08 NOTE — Patient Instructions (Signed)
Vit D3 2000 iu daily  Health Maintenance for Postmenopausal Women Menopause is a normal process in which your ability to get pregnant comes to an end. This process happens slowly over many months or years, usually between the ages of 48 and 55. Menopause is complete when you have missed your menstrual periods for 12 months. It is important to talk with your health care provider about some of the most common conditions that affect women after menopause (postmenopausal women). These include heart disease, cancer, and bone loss (osteoporosis). Adopting a healthy lifestyle and getting preventive care can help to promote your health and wellness. The actions you take can also lower your chances of developing some of these common conditions. What should I know about menopause? During menopause, you may get a number of symptoms, such as:  Hot flashes. These can be moderate or severe.  Night sweats.  Decrease in sex drive.  Mood swings.  Headaches.  Tiredness.  Irritability.  Memory problems.  Insomnia. Choosing to treat or not to treat these symptoms is a decision that you make with your health care provider. Do I need hormone replacement therapy?  Hormone replacement therapy is effective in treating symptoms that are caused by menopause, such as hot flashes and night sweats.  Hormone replacement carries certain risks, especially as you become older. If you are thinking about using estrogen or estrogen with progestin, discuss the benefits and risks with your health care provider. What is my risk for heart disease and stroke? The risk of heart disease, heart attack, and stroke increases as you age. One of the causes may be a change in the body's hormones during menopause. This can affect how your body uses dietary fats, triglycerides, and cholesterol. Heart attack and stroke are medical emergencies. There are many things that you can do to help prevent heart disease and stroke. Watch your  blood pressure  High blood pressure causes heart disease and increases the risk of stroke. This is more likely to develop in people who have high blood pressure readings, are of African descent, or are overweight.  Have your blood pressure checked: ? Every 3-5 years if you are 18-39 years of age. ? Every year if you are 40 years old or older. Eat a healthy diet   Eat a diet that includes plenty of vegetables, fruits, low-fat dairy products, and lean protein.  Do not eat a lot of foods that are high in solid fats, added sugars, or sodium. Get regular exercise Get regular exercise. This is one of the most important things you can do for your health. Most adults should:  Try to exercise for at least 150 minutes each week. The exercise should increase your heart rate and make you sweat (moderate-intensity exercise).  Try to do strengthening exercises at least twice each week. Do these in addition to the moderate-intensity exercise.  Spend less time sitting. Even light physical activity can be beneficial. Other tips  Work with your health care provider to achieve or maintain a healthy weight.  Do not use any products that contain nicotine or tobacco, such as cigarettes, e-cigarettes, and chewing tobacco. If you need help quitting, ask your health care provider.  Know your numbers. Ask your health care provider to check your cholesterol and your blood sugar (glucose). Continue to have your blood tested as directed by your health care provider. Do I need screening for cancer? Depending on your health history and family history, you may need to have cancer screening at   different stages of your life. This may include screening for:  Breast cancer.  Cervical cancer.  Lung cancer.  Colorectal cancer. What is my risk for osteoporosis? After menopause, you may be at increased risk for osteoporosis. Osteoporosis is a condition in which bone destruction happens more quickly than new bone  creation. To help prevent osteoporosis or the bone fractures that can happen because of osteoporosis, you may take the following actions:  If you are 19-50 years old, get at least 1,000 mg of calcium and at least 600 mg of vitamin D per day.  If you are older than age 50 but younger than age 70, get at least 1,200 mg of calcium and at least 600 mg of vitamin D per day.  If you are older than age 70, get at least 1,200 mg of calcium and at least 800 mg of vitamin D per day. Smoking and drinking excessive alcohol increase the risk of osteoporosis. Eat foods that are rich in calcium and vitamin D, and do weight-bearing exercises several times each week as directed by your health care provider. How does menopause affect my mental health? Depression may occur at any age, but it is more common as you become older. Common symptoms of depression include:  Low or sad mood.  Changes in sleep patterns.  Changes in appetite or eating patterns.  Feeling an overall lack of motivation or enjoyment of activities that you previously enjoyed.  Frequent crying spells. Talk with your health care provider if you think that you are experiencing depression. General instructions See your health care provider for regular wellness exams and vaccines. This may include:  Scheduling regular health, dental, and eye exams.  Getting and maintaining your vaccines. These include: ? Influenza vaccine. Get this vaccine each year before the flu season begins. ? Pneumonia vaccine. ? Shingles vaccine. ? Tetanus, diphtheria, and pertussis (Tdap) booster vaccine. Your health care provider may also recommend other immunizations. Tell your health care provider if you have ever been abused or do not feel safe at home. Summary  Menopause is a normal process in which your ability to get pregnant comes to an end.  This condition causes hot flashes, night sweats, decreased interest in sex, mood swings, headaches, or lack of  sleep.  Treatment for this condition may include hormone replacement therapy.  Take actions to keep yourself healthy, including exercising regularly, eating a healthy diet, watching your weight, and checking your blood pressure and blood sugar levels.  Get screened for cancer and depression. Make sure that you are up to date with all your vaccines. This information is not intended to replace advice given to you by your health care provider. Make sure you discuss any questions you have with your health care provider. Document Released: 07/19/2005 Document Revised: 05/20/2018 Document Reviewed: 05/20/2018 Elsevier Patient Education  2020 [employer] Inc[employer]  

## 2019-02-08 NOTE — Progress Notes (Signed)
Erika Davis Apr 17, 1956 [id number]    History:    Presents for annual exam.  Postmenopausal on no HRT with no bleeding.  Normal Pap and mammogram history.  02/2018 DEXA showed stability/improvement on Fosamax has now completed 5 years.  Repeat DEXA next year.  Primary care manages anxiety/depression, hypercholesteremia.  2018 benign colon polyp 3-year follow-up.  Past medical history, past surgical history, family history and social history were all reviewed and documented in the EPIC chart.  Working part-time shipments.  [place].  Retired from [employer].  2 children both doing well.  Mother history of uterine cancer in her 64s with metastasis.  Husband history of prostate cancer, rare intercourse.  ROS:  A ROS was performed and pertinent positives and negatives are included.  Exam:  Vitals:   02/08/19 1025  BP: 124/80  Weight: 143 lb (64.9 kg)  Height: 5\' 3"  (1.6 m)   Body mass index is 25.33 kg/m.   General appearance:  Normal Thyroid:  Symmetrical, normal in size, without palpable masses or nodularity. Respiratory  Auscultation:  Clear without wheezing or rhonchi Cardiovascular  Auscultation:  Regular rate, without rubs, murmurs or gallops  Edema/varicosities:  Not grossly evident Abdominal  Soft,nontender, without masses, guarding or rebound.  Liver/spleen:  No organomegaly noted  Hernia:  None appreciated  Skin  Inspection:  Grossly normal   Breasts: Examined lying and sitting.     Right: Without masses, retractions, discharge or axillary adenopathy.     Left: Without masses, retractions, discharge or axillary adenopathy. Gentitourinary   Inguinal/mons:  Normal without inguinal adenopathy  External genitalia:  Normal  BUS/Urethra/Skene's glands:  Normal  Vagina:  Normal  Cervix:  Normal  Uterus:   normal in size, shape and contour.  Midline and mobile  Adnexa/parametria:     Rt: Without masses or tenderness.   Lt: Without masses or tenderness.  Anus and  perineum: Normal  Digital rectal exam: Normal sphincter tone without palpated masses or tenderness  Assessment/Plan:  63 y.o. MWF G3P2 for annual exam with no complaints.  Postmenopausal/no HRT/no bleeding Osteoporosis completed 5 years of Fosamax last DEXA stable/improvement Anxiety/depression, hypercholesteremia-primary care manages labs and meds  Plan: Reviewed importance of calling immediately if ever postmenopausal bleeding, mother history of uterine cancer.  SBEs, continue annual screening mammogram, calcium rich foods, vitamin D 2000 daily encouraged.  Reviewed importance of weightbearing and balance type exercise, home safety, fall prevention discussed.  Repeat DEXA in 1 year.  Pap normal 2019, new screening guidelines reviewed.    [place], 12:51 PM 02/08/2019

## 2019-03-09 ENCOUNTER — Encounter: Payer: Self-pay | Admitting: Gynecology

## 2019-04-25 ENCOUNTER — Other Ambulatory Visit: Payer: Self-pay | Admitting: Women's Health

## 2019-04-26 NOTE — Telephone Encounter (Signed)
Per note on 02/08/19 "Osteoporosis completed 5 years of Fosamax last DEXA stable/improvement"  No bone medication for now based off office note?

## 2019-04-26 NOTE — Telephone Encounter (Signed)
Please call and review she has completed the 5 years of Fosamax no longer needs to continue.  Thank you for being so thorough and reading the note

## 2019-05-27 DIAGNOSIS — J4520 Mild intermittent asthma, uncomplicated: Secondary | ICD-10-CM | POA: Diagnosis not present

## 2019-05-27 DIAGNOSIS — E559 Vitamin D deficiency, unspecified: Secondary | ICD-10-CM | POA: Diagnosis not present

## 2019-05-27 DIAGNOSIS — F338 Other recurrent depressive disorders: Secondary | ICD-10-CM | POA: Diagnosis not present

## 2019-05-27 DIAGNOSIS — E785 Hyperlipidemia, unspecified: Secondary | ICD-10-CM | POA: Diagnosis not present

## 2019-05-27 DIAGNOSIS — Z0000 Encounter for general adult medical examination without abnormal findings: Secondary | ICD-10-CM | POA: Diagnosis not present

## 2019-05-31 DIAGNOSIS — M810 Age-related osteoporosis without current pathological fracture: Secondary | ICD-10-CM | POA: Diagnosis not present

## 2019-05-31 DIAGNOSIS — E785 Hyperlipidemia, unspecified: Secondary | ICD-10-CM | POA: Diagnosis not present

## 2019-08-02 ENCOUNTER — Encounter: Payer: Self-pay | Admitting: Women's Health

## 2020-02-16 ENCOUNTER — Other Ambulatory Visit: Payer: Self-pay

## 2020-02-16 ENCOUNTER — Encounter: Payer: Self-pay | Admitting: Nurse Practitioner

## 2020-02-16 ENCOUNTER — Ambulatory Visit (INDEPENDENT_AMBULATORY_CARE_PROVIDER_SITE_OTHER): Payer: Medicare HMO | Admitting: Nurse Practitioner

## 2020-02-16 VITALS — BP 123/74 | Ht 63.0 in | Wt 149.4 lb

## 2020-02-16 DIAGNOSIS — F419 Anxiety disorder, unspecified: Secondary | ICD-10-CM

## 2020-02-16 DIAGNOSIS — Z780 Asymptomatic menopausal state: Secondary | ICD-10-CM | POA: Diagnosis not present

## 2020-02-16 DIAGNOSIS — F329 Major depressive disorder, single episode, unspecified: Secondary | ICD-10-CM | POA: Diagnosis not present

## 2020-02-16 DIAGNOSIS — M810 Age-related osteoporosis without current pathological fracture: Secondary | ICD-10-CM | POA: Diagnosis not present

## 2020-02-16 DIAGNOSIS — Z01419 Encounter for gynecological examination (general) (routine) without abnormal findings: Secondary | ICD-10-CM | POA: Diagnosis not present

## 2020-02-16 DIAGNOSIS — F32A Depression, unspecified: Secondary | ICD-10-CM

## 2020-02-16 NOTE — Progress Notes (Signed)
   KRISTEE ANGUS 1956-02-12 [id number]   History:  64 y.o. [id number] presents for breast and pelvic exam without GYN complaints. Postmenopausal - no HRT, no bleeding. Normal pap and mammogram history. History of osteoporosis, was on Fosamax for 5 years, stopped 1 year ago. Mother with history of uterine cancer. Would like to wean Effexor.   Gynecologic History No LMP recorded. Patient is postmenopausal.   Last Pap: 02/04/2018. Results were: normal Last mammogram: 08/02/2019. Results were: normal Last colonoscopy: 2018. Results were: polyp, 3 year follow up recommended Last Dexa: 02/10/2018. Results were: t-score -2.0  Past medical history, past surgical history, family history and social history were all reviewed and documented in the EPIC chart.  ROS:  A ROS was performed and pertinent positives and negatives are included.  Exam:  Vitals:   02/16/20 1051  BP: 123/74  Weight: 149 lb 6.4 oz (67.8 kg)  Height: 5\' 3"  (1.6 m)   Body mass index is 26.47 kg/m.  General appearance:  Normal Thyroid:  Symmetrical, normal in size, without palpable masses or nodularity. Respiratory  Auscultation:  Clear without wheezing or rhonchi Cardiovascular  Auscultation:  Regular rate, without rubs, murmurs or gallops  Edema/varicosities:  Not grossly evident Abdominal  Soft,nontender, without masses, guarding or rebound.  Liver/spleen:  No organomegaly noted  Hernia:  None appreciated  Skin  Inspection:  Grossly normal   Breasts: Examined lying and sitting.   Right: Without masses, retractions, discharge or axillary adenopathy.   Left: Without masses, retractions, discharge or axillary adenopathy. Gentitourinary   Inguinal/mons:  Normal without inguinal adenopathy  External genitalia:  Normal  BUS/Urethra/Skene's glands:  Normal  Vagina:  Atrophy   Cervix:  Normal  Uterus:  Normal in size, shape and contour.  Midline and mobile  Adnexa/parametria:     Rt: Without masses or  tenderness.   Lt: Without masses or tenderness.  Anus and perineum: Non-bleeding hemorrhoids   Assessment/Plan:  64 y.o. 75 for breast and pelvic exam.   Well female exam with routine gynecological exam - Education provided on SBEs, importance of preventative screenings, current guidelines, high calcium diet, regular exercise, and multivitamin daily. Discussed options to stop cervical cancer screenings per guidelines. We both agree to continue with mother's history of uterine cancer. We will repeat pap next year at the 3-year interval. Labs with PCP.   Age-related osteoporosis without current pathological fracture - Plan: DG Bone Density. Fosamax x 5 years, stopped last year. Had good improvement with treatment. Exercises/walks daily, Vitamin D supplement daily. She will schedule bone density soon. Most recent 02/2018 showed t-score -2.0.   Postmenopausal - Plan: DG Bone Density  Anxiety and depression - wants to stop Effexor. Instructed to wean slowly by taking every other day for 2 weeks then stopping.  Follow up in 1 year for annual      03/2018 [facility], 11:16 AM 02/16/2020

## 2020-02-16 NOTE — Patient Instructions (Signed)

## 2020-08-22 ENCOUNTER — Other Ambulatory Visit: Payer: Self-pay | Admitting: Nurse Practitioner

## 2020-08-22 ENCOUNTER — Other Ambulatory Visit: Payer: Self-pay

## 2020-08-22 ENCOUNTER — Ambulatory Visit (INDEPENDENT_AMBULATORY_CARE_PROVIDER_SITE_OTHER): Payer: Medicare Other

## 2020-08-22 DIAGNOSIS — M8589 Other specified disorders of bone density and structure, multiple sites: Secondary | ICD-10-CM

## 2020-08-22 DIAGNOSIS — Z780 Asymptomatic menopausal state: Secondary | ICD-10-CM | POA: Diagnosis not present

## 2020-08-22 DIAGNOSIS — M810 Age-related osteoporosis without current pathological fracture: Secondary | ICD-10-CM

## 2020-09-10 ENCOUNTER — Encounter: Payer: Self-pay | Admitting: Nurse Practitioner

## 2020-09-14 ENCOUNTER — Encounter: Payer: Self-pay | Admitting: Nurse Practitioner

## 2020-09-18 MED ORDER — ALENDRONATE SODIUM 70 MG PO TABS: 70.0000 mg | ORAL_TABLET | ORAL | 4 refills | Status: DC

## 2020-09-18 NOTE — Telephone Encounter (Signed)
I called patient and asked all questions and Rx sent for fosamax.

## 2020-12-08 ENCOUNTER — Other Ambulatory Visit: Payer: Self-pay | Admitting: Obstetrics & Gynecology

## 2021-01-31 ENCOUNTER — Encounter (HOSPITAL_BASED_OUTPATIENT_CLINIC_OR_DEPARTMENT_OTHER): Payer: Self-pay

## 2021-01-31 ENCOUNTER — Other Ambulatory Visit: Payer: Self-pay

## 2021-01-31 ENCOUNTER — Emergency Department (HOSPITAL_BASED_OUTPATIENT_CLINIC_OR_DEPARTMENT_OTHER)
Admission: EM | Admit: 2021-01-31 | Discharge: 2021-01-31 | Disposition: A | Discharge status: Home / Self Care | Payer: Medicare Other | Attending: Emergency Medicine | Admitting: Emergency Medicine

## 2021-01-31 ENCOUNTER — Encounter (HOSPITAL_BASED_OUTPATIENT_CLINIC_OR_DEPARTMENT_OTHER): Payer: Self-pay | Admitting: Plastic Surgery

## 2021-01-31 ENCOUNTER — Emergency Department (HOSPITAL_BASED_OUTPATIENT_CLINIC_OR_DEPARTMENT_OTHER): Payer: Medicare Other

## 2021-01-31 DIAGNOSIS — S52571A Other intraarticular fracture of lower end of right radius, initial encounter for closed fracture: Secondary | ICD-10-CM

## 2021-01-31 DIAGNOSIS — Z7951 Long term (current) use of inhaled steroids: Secondary | ICD-10-CM | POA: Insufficient documentation

## 2021-01-31 DIAGNOSIS — Z7982 Long term (current) use of aspirin: Secondary | ICD-10-CM | POA: Insufficient documentation

## 2021-01-31 DIAGNOSIS — S6991XA Unspecified injury of right wrist, hand and finger(s), initial encounter: Secondary | ICD-10-CM | POA: Diagnosis present

## 2021-01-31 DIAGNOSIS — J45909 Unspecified asthma, uncomplicated: Secondary | ICD-10-CM | POA: Insufficient documentation

## 2021-01-31 DIAGNOSIS — W08XXXA Fall from other furniture, initial encounter: Secondary | ICD-10-CM | POA: Diagnosis not present

## 2021-01-31 DIAGNOSIS — S52501A Unspecified fracture of the lower end of right radius, initial encounter for closed fracture: Secondary | ICD-10-CM | POA: Insufficient documentation

## 2021-01-31 MED ORDER — OXYCODONE-ACETAMINOPHEN 5-325 MG PO TABS: 1.0000 | ORAL_TABLET | Freq: Four times a day (QID) | ORAL | 0 refills | Status: DC | PRN

## 2021-01-31 MED ORDER — ACETAMINOPHEN 500 MG PO TABS
1000.0000 mg | ORAL_TABLET | Freq: Once | ORAL | Status: AC
  Administered 2021-01-31: 1000 mg via ORAL
  Filled 2021-01-31: qty 2

## 2021-01-31 MED ORDER — MELOXICAM 15 MG PO TABS: 15.0000 mg | ORAL_TABLET | Freq: Every day | ORAL | 0 refills | Status: DC

## 2021-01-31 MED ORDER — KETOROLAC TROMETHAMINE 60 MG/2ML IM SOLN
30.0000 mg | Freq: Once | INTRAMUSCULAR | Status: AC
  Administered 2021-01-31: 30 mg via INTRAMUSCULAR
  Filled 2021-01-31: qty 2

## 2021-01-31 NOTE — ED Notes (Signed)
As I write this, Dr. Donnald Garre is assisting with the application of the splint.

## 2021-01-31 NOTE — ED Provider Notes (Addendum)
[facility] EMERGENCY DEPT Provider Note   CSN: [id number] Arrival date & time: 01/31/21  2229     History Chief Complaint  Patient presents with   Erika Davis is a 65 y.o. female.  The history is provided by the patient.  Fall This is a new problem. The current episode started 6 to 12 hours ago. The problem occurs rarely. The problem has been resolved. Pertinent negatives include no chest pain, no abdominal pain, no headaches and no shortness of breath. Nothing aggravates the symptoms. Nothing relieves the symptoms. Treatments tried: 2 oxycontins. The treatment provided mild relief.  Patient with osteoporosis who fell on an outstretched R hand over an ottoman and presents with ongoing right hand and R wrist pain.  Did not strike head or injure any other body part.      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   History of colon polyps 2008    HYPERPLASTIC-BENIGN   Osteoporosis 02/2018   T score -2.0 improved on Fosamax from prior DEXA 2015 T score -2.6   Raynaud phenomenon    "fingers with poorer circulation at times"    Patient Active Problem List   Diagnosis Date Noted   Osteoporosis 11/23/2013   Anxiety and depression 11/23/2013   Arthritis     Past Surgical History:  Procedure Laterality Date   BLADDER SURGERY     BLADDER SUSPENSION     CARPOMETACARPEL SUSPENSION PLASTY Right 06/01/2018   Procedure: RIGHT THUMB CARPOMETACARPEL (CMC) SUSPENSION PLASTY;  Surgeon: Mack Hook, MD;  Location: [facility];  Service: Orthopedics;  Laterality: Right;   EYE SURGERY  1985   THUMB SURGERY  2019   TUBAL LIGATION  1980     OB History     Gravida  3   Para  2   Term  2   Preterm      AB  1   Living  2      SAB  1   IAB      Ectopic      Multiple      Live Births              Family History  Problem Relation Age of Onset   Cancer  Mother        UTERINE   Prostate cancer Father    Cancer Maternal Grandmother        Lung   Stroke Maternal Grandfather     Social History   Tobacco Use   Smoking status: Never   Smokeless tobacco: Never  Vaping Use   Vaping Use: Never used  Substance Use Topics   Alcohol use: Yes    Alcohol/week: 8.0 standard drinks    Types: 8 Standard drinks or equivalent per week    Comment: daily wine   Drug use: No    Home Medications Prior to Admission medications   Medication Sig Start Date End Date Taking? Authorizing Provider  ALBUTEROL IN Inhale into the lungs. PRN SOB    [provider]  alendronate (FOSAMAX) 70 MG tablet TAKE 1 TABLET (70 MG TOTAL) BY MOUTH EVERY 7 (SEVEN) DAYS. TAKE WITH A FULL GLASS OF WATER ON AN EMPTY STOMACH. 12/08/20   Genia Del, MD  ASPIRIN PO Take 81 mg by mouth.     [provider]  atorvastatin (LIPITOR) 10 MG tablet Take 10 mg  by mouth daily.    [provider]  beclomethasone (QVAR) 80 MCG/ACT inhaler Inhale 1 puff into the lungs 2 (two) times daily.    [provider]  cholecalciferol (VITAMIN D3) 25 MCG (1000 UNIT) tablet Take 5,000 Units by mouth daily.    [provider]  ibuprofen (ADVIL) 200 MG tablet Take 3 tablets (600 mg total) by mouth every 6 (six) hours. 06/01/18   Mack Hook, MD  levocetirizine (XYZAL) 5 MG tablet Take 5 mg by mouth every evening.    [provider]  traZODone (DESYREL) 50 MG tablet TAKE 1 TABLET (50 MG TOTAL) BY MOUTH 3 TIMES/DAY AS NEEDED-BETWEEN MEALS & BEDTIME. 07/20/18   Harrington Challenger, NP  venlafaxine XR (EFFEXOR-XR) 37.5 MG 24 hr capsule Take 1 capsule by mouth daily. 01/29/18   [provider]  vitamin B-12 (CYANOCOBALAMIN) 500 MCG tablet Take 500 mcg by mouth daily.    [provider]    Allergies    Patient has no allergy information on record.  Review of Systems   Review of Systems  Constitutional:  Negative for fever and  unexpected weight change.  HENT:  Negative for facial swelling.   Eyes:  Negative for redness.  Respiratory:  Negative for shortness of breath.   Cardiovascular:  Negative for chest pain.  Gastrointestinal:  Negative for abdominal pain.  Genitourinary:  Negative for difficulty urinating.  Musculoskeletal:  Positive for arthralgias.  Skin:  Negative for rash.  Neurological:  Negative for headaches.  Psychiatric/Behavioral:  Negative for agitation.   All other systems reviewed and are negative.  Physical Exam Updated Vital Signs Ht 5\' 3"  (1.6 m)   Wt 69.4 kg   BMI 27.10 kg/m   Physical Exam Vitals and nursing note reviewed.  Constitutional:      General: She is not in acute distress.    Appearance: Normal appearance.  HENT:     Head: Normocephalic and atraumatic.     Nose: Nose normal.  Eyes:     Extraocular Movements: Extraocular movements intact.     Conjunctiva/sclera: Conjunctivae normal.  Cardiovascular:     Rate and Rhythm: Normal rate and regular rhythm.     Pulses: Normal pulses.     Heart sounds: Normal heart sounds.  Pulmonary:     Effort: Pulmonary effort is normal.     Breath sounds: Normal breath sounds.  Abdominal:     General: Abdomen is flat. Bowel sounds are normal.     Palpations: Abdomen is soft.     Tenderness: There is no abdominal tenderness. There is no guarding.  Musculoskeletal:     Right elbow: Normal.     Right forearm: Normal.     Right wrist: Tenderness present. No effusion, lacerations or crepitus. Normal range of motion. Normal pulse.     Left wrist: Normal.     Right hand: Swelling and tenderness present. Normal range of motion. Normal strength. Normal sensation. There is no disruption of two-point discrimination. Normal capillary refill. Normal pulse.     Cervical back: Normal range of motion and neck supple.  Skin:    General: Skin is warm and dry.     Capillary Refill: Capillary refill takes less than 2 seconds.  Neurological:      General: No focal deficit present.     Mental Status: She is alert and oriented to person, place, and time.     Deep Tendon Reflexes: Reflexes normal.  Psychiatric:  Mood and Affect: Mood normal.        Behavior: Behavior normal.    ED Results / Procedures / Treatments   Labs (all labs ordered are listed, but only abnormal results are displayed) Labs Reviewed - No data to display  EKG None  Radiology No results found.  Procedures Procedures   Medications Ordered in ED Medications  ketorolac (TORADOL) injection 30 mg (has no administration in time range)  acetaminophen (TYLENOL) tablet 1,000 mg (has no administration in time range)    ED Course  I have reviewed the triage vital signs and the nursing notes.  Pertinent labs & imaging results that were available during my care of the patient were reviewed by me and considered in my medical decision making (see chart for details).  To be placed in the thumb spica and sling. Ice, elevation and pain medication RX given.  Outpatient referral to hand surgery to be made at the time of discharge.  Please call to schedule an outpatient follow up appointment.  Erika Davis was evaluated in Emergency Department on 01/31/2021 for the symptoms described in the history of present illness. She was evaluated in the context of the global COVID-19 pandemic, which necessitated consideration that the patient might be at risk for infection with the SARS-CoV-2 virus that causes COVID-19. Institutional protocols and algorithms that pertain to the evaluation of patients at risk for COVID-19 are in a state of rapid change based on information released by regulatory bodies including the CDC and federal and state organizations. These policies and algorithms were followed during the patient's care in the ED.  Final Clinical Impression(s) / ED Diagnoses Signed out to Dr. Donnald Garre pending Xray results    Marquasia Schmieder, MD 01/31/21 [phone]

## 2021-01-31 NOTE — ED Triage Notes (Signed)
Patient here POV from Home with Right Wrist Injury.  Patient states she fell off an [place] yesterday PM and injured her Right Wrist. Patient took 2 OxyContin yesterday with Minimal Relief. Moderate Swelling to Right Wrist.   Ambulatory. GCS 15. A&Ox4. NAD Noted during Triage. Patient in Arm Sling from Home. No Hx of Blood Thinners. No Other Injury Noted.

## 2021-02-05 ENCOUNTER — Ambulatory Visit (HOSPITAL_BASED_OUTPATIENT_CLINIC_OR_DEPARTMENT_OTHER): Admission: RE | Admit: 2021-02-05 | Payer: Medicare Other | Source: Home / Self Care | Admitting: Plastic Surgery

## 2021-02-05 HISTORY — DX: Anxiety disorder, unspecified: F41.9

## 2021-02-05 HISTORY — DX: Depression, unspecified: F32.A

## 2021-02-05 HISTORY — DX: Unspecified fracture of right forearm, initial encounter for closed fracture: S52.91XA

## 2021-02-05 SURGERY — OPEN REDUCTION INTERNAL FIXATION (ORIF) RADIAL FRACTURE
Anesthesia: General | Site: Wrist | Laterality: Right

## 2021-02-10 ENCOUNTER — Other Ambulatory Visit: Payer: Self-pay

## 2021-02-10 ENCOUNTER — Emergency Department (HOSPITAL_BASED_OUTPATIENT_CLINIC_OR_DEPARTMENT_OTHER)
Admission: EM | Admit: 2021-02-10 | Discharge: 2021-02-10 | Disposition: A | Discharge status: Home / Self Care | Payer: Medicare Other | Attending: Emergency Medicine | Admitting: Emergency Medicine

## 2021-02-10 ENCOUNTER — Encounter (HOSPITAL_BASED_OUTPATIENT_CLINIC_OR_DEPARTMENT_OTHER): Payer: Self-pay

## 2021-02-10 DIAGNOSIS — G8918 Other acute postprocedural pain: Secondary | ICD-10-CM | POA: Insufficient documentation

## 2021-02-10 DIAGNOSIS — J45909 Unspecified asthma, uncomplicated: Secondary | ICD-10-CM | POA: Insufficient documentation

## 2021-02-10 DIAGNOSIS — M25531 Pain in right wrist: Secondary | ICD-10-CM | POA: Diagnosis present

## 2021-02-10 DIAGNOSIS — Z7982 Long term (current) use of aspirin: Secondary | ICD-10-CM | POA: Insufficient documentation

## 2021-02-10 MED ORDER — KETOROLAC TROMETHAMINE 60 MG/2ML IM SOLN
60.0000 mg | Freq: Once | INTRAMUSCULAR | Status: AC
  Administered 2021-02-10: 60 mg via INTRAMUSCULAR
  Filled 2021-02-10: qty 2

## 2021-02-10 MED ORDER — ONDANSETRON 4 MG PO TBDP
8.0000 mg | ORAL_TABLET | Freq: Once | ORAL | Status: AC
  Administered 2021-02-10: 8 mg via ORAL
  Filled 2021-02-10: qty 2

## 2021-02-10 MED ORDER — HYDROMORPHONE HCL 1 MG/ML IJ SOLN
1.0000 mg | Freq: Once | INTRAMUSCULAR | Status: AC
  Administered 2021-02-10: 1 mg via INTRAMUSCULAR
  Filled 2021-02-10: qty 1

## 2021-02-10 MED ORDER — KETAMINE HCL 10 MG/ML IJ SOLN
0.3000 mg/kg | Freq: Once | INTRAMUSCULAR | Status: AC
  Administered 2021-02-10: 21 mg via INTRAVENOUS
  Filled 2021-02-10: qty 1

## 2021-02-10 NOTE — ED Triage Notes (Signed)
Had wrist fx repair yesterday around noon and block has worn off and has taken tylenol and oxycodone and has had no relief.  Able to move fingers warm pink cap refill < 3secs

## 2021-02-10 NOTE — ED Notes (Signed)
Pt attached to 02, BP and cardiac monitor.

## 2021-02-10 NOTE — ED Notes (Signed)
Pt states pain has improved and it is tolerable.

## 2021-02-10 NOTE — ED Notes (Signed)
MD notified of pain score and minimal improvement from meds.

## 2021-02-10 NOTE — ED Provider Notes (Addendum)
[facility] EMERGENCY DEPT Provider Note   CSN: [id number] Arrival date & time: 02/10/21  0101     History Chief Complaint  Patient presents with   Post-op Problem    Erika Davis is a 65 y.o. female.  Patient presents to the emergency department for evaluation of severe wrist pain.  Patient had surgery this morning for a wrist fracture.  Patient had a shoulder block performed prior to the procedure.  As the block has worn off today she has had progressively increasing pain in the wrist.  Patient has taken Tylenol, Mobic and oxycodone without improvement.      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   Depression    History of colon polyps 2008   HYPERPLASTIC-BENIGN   Osteoporosis 02/2018   T score -2.0 improved on Fosamax from prior DEXA 2015 T score -2.6   Raynaud phenomenon    "fingers with poorer circulation at times"   Right radial fracture     Patient Active Problem List   Diagnosis Date Noted   Osteoporosis 11/23/2013   Anxiety and depression 11/23/2013   Arthritis     Past Surgical History:  Procedure Laterality Date   BLADDER SURGERY     BLADDER SUSPENSION     CARPOMETACARPEL SUSPENSION PLASTY Right 06/01/2018   Procedure: RIGHT THUMB CARPOMETACARPEL (CMC) SUSPENSION PLASTY;  Surgeon: Mack Hook, MD;  Location: [facility];  Service: Orthopedics;  Laterality: Right;   EYE SURGERY  1985   THUMB SURGERY  2019   TUBAL LIGATION  1980     OB History     Gravida  3   Para  2   Term  2   Preterm      AB  1   Living  2      SAB  1   IAB      Ectopic      Multiple      Live Births              Family History  Problem Relation Age of Onset   Cancer Mother        UTERINE   Prostate cancer Father    Cancer Maternal Grandmother        Lung   Stroke Maternal Grandfather     Social History   Tobacco Use   Smoking  status: Never   Smokeless tobacco: Never  Vaping Use   Vaping Use: Never used  Substance Use Topics   Alcohol use: Yes    Alcohol/week: 8.0 standard drinks    Types: 8 Standard drinks or equivalent per week    Comment: daily wine   Drug use: No    Home Medications Prior to Admission medications   Medication Sig Start Date End Date Taking? Authorizing Provider  ALBUTEROL IN Inhale into the lungs. PRN SOB    [provider]  alendronate (FOSAMAX) 70 MG tablet TAKE 1 TABLET (70 MG TOTAL) BY MOUTH EVERY 7 (SEVEN) DAYS. TAKE WITH A FULL GLASS OF WATER ON AN EMPTY STOMACH. 12/08/20   Genia Del, MD  ASPIRIN PO Take 81 mg by mouth.     [provider]  atorvastatin (LIPITOR) 10 MG tablet Take 10 mg by mouth daily.    [provider]  beclomethasone (QVAR) 80 MCG/ACT inhaler Inhale 1 puff into the lungs 2 (two) times daily.  [provider]  cholecalciferol (VITAMIN D3) 25 MCG (1000 UNIT) tablet Take 5,000 Units by mouth daily.    [provider]  ibuprofen (ADVIL) 200 MG tablet Take 3 tablets (600 mg total) by mouth every 6 (six) hours. 06/01/18   Mack Hook, MD  levocetirizine (XYZAL) 5 MG tablet Take 5 mg by mouth every evening.    [provider]  meloxicam (MOBIC) 15 MG tablet Take 1 tablet (15 mg total) by mouth daily. 01/31/21   Palumbo, April, MD  Multiple Vitamin (MULTIVITAMIN WITH MINERALS) TABS tablet Take 1 tablet by mouth daily.    [provider]  oxyCODONE-acetaminophen (PERCOCET) 5-325 MG tablet Take 1 tablet by mouth every 6 (six) hours as needed. 01/31/21   Palumbo, April, MD  traZODone (DESYREL) 50 MG tablet TAKE 1 TABLET (50 MG TOTAL) BY MOUTH 3 TIMES/DAY AS NEEDED-BETWEEN MEALS & BEDTIME. Patient taking differently: Take 50 mg by mouth at bedtime. 07/20/18   Harrington Challenger, NP  venlafaxine XR (EFFEXOR-XR) 37.5 MG 24 hr capsule Take 1 capsule by mouth daily. 01/29/18   [provider]  vitamin  B-12 (CYANOCOBALAMIN) 500 MCG tablet Take 500 mcg by mouth daily.    [provider]    Allergies    Patient has no known allergies.  Review of Systems   Review of Systems  Musculoskeletal:  Positive for arthralgias.  Neurological: Negative.    Physical Exam Updated Vital Signs BP 108/61 (BP Location: Left Arm)   Pulse (!) 50   Temp 98.4 F (36.9 C) (Oral)   Resp 16   Ht 5\' 3"  (1.6 m)   Wt 70.3 kg   SpO2 94%   BMI 27.46 kg/m   Physical Exam Constitutional:      Appearance: Normal appearance.  HENT:     Head: Atraumatic.  Cardiovascular:     Rate and Rhythm: Normal rate.  Pulmonary:     Effort: Pulmonary effort is normal.  Musculoskeletal:     Right wrist: Swelling, tenderness and bony tenderness present. Decreased range of motion.     Comments: Volar splint in place, mild swelling noted.  Normal sensation across all nerve distributions in the fingers.  Normal capillary refill.  Skin:    Comments: Surgical incision not visualized, covered by cast material  Neurological:     Mental Status: She is alert.     Sensory: Sensation is intact.     Motor: Motor function is intact.    ED Results / Procedures / Treatments   Labs (all labs ordered are listed, but only abnormal results are displayed) Labs Reviewed - No data to display  EKG None  Radiology No results found.  Procedures Procedures   Medications Ordered in ED Medications  ketorolac (TORADOL) injection 60 mg (has no administration in time range)  HYDROmorphone (DILAUDID) injection 1 mg (has no administration in time range)  ondansetron (ZOFRAN-ODT) disintegrating tablet 8 mg (has no administration in time range)    ED Course  I have reviewed the triage vital signs and the nursing notes.  Pertinent labs & imaging results that were available during my care of the patient were reviewed by me and considered in my medical decision making (see chart for details).    MDM Rules/Calculators/A&P                            Patient presents to the emergency department for evaluation of right wrist pain.  This is  postoperative pain secondary to open reduction and internal fixation of right wrist fracture that occurred earlier today.  Patient is not in a circumferential cast, has normal distal sensation and blood flow.  This does not require work-up for any postoperative complications, is expected pain secondary to the surgery.  Provided additional analgesia.  Patient reported only mild improvement with Toradol and Dilaudid.  She was therefore administered analgesic dose of ketamine with improvement.  Final Clinical Impression(s) / ED Diagnoses Final diagnoses:  Postoperative pain    Rx / DC Orders ED Discharge Orders     None        Kisha Messman, Canary Brim, MD 02/10/21 [id number]    Gilda Crease, MD 02/10/21 [phone]

## 2021-02-10 NOTE — ED Notes (Signed)
Pt verbalizes understanding of discharge instructions. Opportunity for questioning and answers were provided. Armand removed by staff, pt discharged from ED to home. Educated to f/u with Careers adviser.

## 2021-02-19 ENCOUNTER — Other Ambulatory Visit: Payer: Self-pay

## 2021-02-19 ENCOUNTER — Ambulatory Visit (INDEPENDENT_AMBULATORY_CARE_PROVIDER_SITE_OTHER): Payer: Medicare Other | Admitting: Nurse Practitioner

## 2021-02-19 ENCOUNTER — Encounter: Payer: Self-pay | Admitting: Nurse Practitioner

## 2021-02-19 VITALS — BP 120/74 | Ht 62.0 in | Wt 154.0 lb

## 2021-02-19 DIAGNOSIS — Z780 Asymptomatic menopausal state: Secondary | ICD-10-CM

## 2021-02-19 DIAGNOSIS — M8589 Other specified disorders of bone density and structure, multiple sites: Secondary | ICD-10-CM

## 2021-02-19 DIAGNOSIS — Z01419 Encounter for gynecological examination (general) (routine) without abnormal findings: Secondary | ICD-10-CM

## 2021-02-19 MED ORDER — HYDROCORTISONE 1 % EX OINT: 1.0000 "application " | TOPICAL_OINTMENT | Freq: Two times a day (BID) | CUTANEOUS | 0 refills | Status: DC

## 2021-02-19 NOTE — Progress Notes (Signed)
   Erika Davis 09-04-1955 [id number]   History:  65 y.o. [id number] presents for breast and pelvic exam without GYN complaints. Postmenopausal - no HRT, no bleeding. Normal pap and mammogram history. History of osteoporosis, was on Fosamax for 5 years, stopped for 1 year and was restarted 09/2020. ORIF of right wrist 02/10/2021 following a fall.   Gynecologic History No LMP recorded. Patient is postmenopausal.   Health maintenance Last Pap: 02/04/2018. Results were: normal, 5-year repeat Last mammogram: 08/07/2020. Results were: normal Last colonoscopy: 2018. Results were: polyp, 5-year recall Last Dexa: 08/22/2020. Results were: T-score -2.3  Past medical history, past surgical history, family history and social history were all reviewed and documented in the EPIC chart. Married. Retired from [employer], therapeutic. Mother with history of uterine cancer.   ROS:  A ROS was performed and pertinent positives and negatives are included.  Exam:  Vitals:   02/19/21 1055  BP: 120/74  Weight: 154 lb (69.9 kg)  Height: 5\' 2"  (1.575 m)    Body mass index is 28.17 kg/m.  General appearance:  Normal Thyroid:  Symmetrical, normal in size, without palpable masses or nodularity. Respiratory  Auscultation:  Clear without wheezing or rhonchi Cardiovascular  Auscultation:  Regular rate, without rubs, murmurs or gallops  Edema/varicosities:  Not grossly evident Abdominal  Soft,nontender, without masses, guarding or rebound.  Liver/spleen:  No organomegaly noted  Hernia:  None appreciated  Skin  Inspection:  Grossly normal   Breasts: Examined lying and sitting.   Right: Without masses, retractions, discharge or axillary adenopathy.   Left: Without masses, retractions, discharge or axillary adenopathy. Gentitourinary   Inguinal/mons:  Normal without inguinal adenopathy  External genitalia:  Normal  BUS/Urethra/Skene's glands:  Normal  Vagina:  Atrophic changes  Cervix:  Normal  Uterus:  Normal in  size, shape and contour.  Midline and mobile  Adnexa/parametria:     Rt: Without masses or tenderness.   Lt: Without masses or tenderness.  Anus and perineum: Non-bleeding hemorrhoids. Redness  Assessment/Plan:  65 y.o. 70 for breast and pelvic exam.   Well female exam with routine gynecological exam - Education provided on SBEs, importance of preventative screenings, current guidelines, high calcium diet, regular exercise, and multivitamin daily. Labs with PCP.  Osteopenia of multiple sites - History of osteoporosis, was on Fosamax for 5 years, stopped for 1 year and restarted 09/2020. ORIF of right wrist 02/09/2021 following a fall. Most recent T-score -2.3. We did discuss option of switching treatment after recent traumatic fracture but we both agree to continue Fosamax at this time.   Postmenopausal - no HRT, no bleeding.   Screening for cervical cancer - Normal Pap history.  Will repeat at 5-year interval per guidelines.  Screening for breast cancer - Normal mammogram history.  Continue annual screenings.  Normal breast exam today.  Screening for colon cancer - 2018 colonoscopy. Will repeat at GI's recommended interval.   Follow up in 2 years for breast and pelvic exam.      2019 [facility], 11:28 AM 02/19/2021

## 2021-02-22 ENCOUNTER — Encounter: Payer: Medicare Other | Admitting: Plastic Surgery

## 2021-04-27 ENCOUNTER — Other Ambulatory Visit: Payer: Self-pay | Admitting: Obstetrics & Gynecology

## 2021-05-14 ENCOUNTER — Ambulatory Visit: Payer: BLUE CROSS/BLUE SHIELD | Admitting: Physician Assistant

## 2021-10-10 ENCOUNTER — Ambulatory Visit (INDEPENDENT_AMBULATORY_CARE_PROVIDER_SITE_OTHER): Payer: Medicare Other

## 2021-10-10 ENCOUNTER — Encounter: Payer: Self-pay | Admitting: Podiatry

## 2021-10-10 ENCOUNTER — Ambulatory Visit (INDEPENDENT_AMBULATORY_CARE_PROVIDER_SITE_OTHER): Payer: Medicare Other | Admitting: Podiatry

## 2021-10-10 DIAGNOSIS — M21611 Bunion of right foot: Secondary | ICD-10-CM | POA: Diagnosis not present

## 2021-10-10 DIAGNOSIS — M79671 Pain in right foot: Secondary | ICD-10-CM

## 2021-10-10 NOTE — Progress Notes (Signed)
?  Subjective:  ?Patient ID: Erika Davis, female    DOB: [birth year],   MRN: [id number] ? ?No chief complaint on file. ? ? ?66 y.o. female presents for concern of right foot bunion that has been bothering her for the past 5 years. Does relates trying different shoes but has started to get a blister in the area of the bunion and relates aching when walking. She is ready to talk about surgery . Denies any other pedal complaints. Denies n/v/f/c.  ? ?Past Medical History:  ?Diagnosis Date  ? Anxiety   ? Arthritis   ? Asthma   ? 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 Depression   ? History of colon polyps 2008  ? HYPERPLASTIC-BENIGN  ? Osteoporosis 02/2018  ? T score -2.0 improved on Fosamax from prior DEXA 2015 T score -2.6  ? Raynaud phenomenon   ? "fingers with poorer circulation at times"  ? Right radial fracture   ? ? ?Objective:  ?Physical Exam: ?Vascular: DP/PT pulses 2/4 bilateral. CFT <3 seconds. Normal hair growth on digits. No edema.  ?Skin. No lacerations or abrasions bilateral feet.  ?Musculoskeletal: MMT 5/5 bilateral lower extremities in DF, PF, Inversion and Eversion. Deceased ROM in DF of ankle joint. 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 No pain with ROM of the joint No pain dorsally or plantarly.  ?Neurological: Sensation intact to light touch.  ? ?Assessment:  ? ?1. Bunion of right foot   ? ? ? ?Plan:  ?Patient was evaluated and treated and all questions answered. ?-Xrays reviewed. 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  ?-Discussed HAV and treatment options;conservative and surgical management; risks, benefits, alternatives discussed. All patient's questions answered. ?-Discussed padding and wide shoe gear.   ?-Recommend continue with good supportive shoes and inserts.  ?Patient has exhausted conservative treatment and would like to discuss surgery.  ?-No significant medical history. No smoking and no history of blood clots.  ?-Does relates  trouble with pain in the past for a wrist surgery and ketamine was the only thing that helped pain.  Will plan to do pop saph block and  oxycodone for pain.  ?Discussed likely would benefit from [place] and possible [place] osteotomy.  ?-Informed surgical risk consent was reviewed and read aloud to the patient.  I reviewed the films.  I have discussed my findings with the patient in great detail.  I have discussed all risks including but not limited to infection, stiffness, scarring, limp, disability, deformity, damage to blood vessels and nerves, numbness, poor healing, need for braces, arthritis, chronic pain, amputation, death.  All benefits and realistic expectations discussed in great detail.  I have made no promises as to the outcome.  I have provided realistic expectations.  I have offered the patient a 2nd opinion, which they have declined and assured me they preferred to proceed despite the risks. ?Patient will be schedule for surgery at a time of her preference.  ? ? ?Louann Sjogren, DPM  ? ? ?

## 2021-10-23 ENCOUNTER — Encounter: Payer: Self-pay | Admitting: Podiatry

## 2021-10-23 ENCOUNTER — Other Ambulatory Visit: Payer: Self-pay | Admitting: Podiatry

## 2021-10-23 DIAGNOSIS — M2011 Hallux valgus (acquired), right foot: Secondary | ICD-10-CM | POA: Diagnosis not present

## 2021-10-23 HISTORY — PX: BUNIONECTOMY: SHX129

## 2021-10-23 MED ORDER — CEPHALEXIN 500 MG PO CAPS: 500.0000 mg | ORAL_CAPSULE | Freq: Four times a day (QID) | ORAL | 0 refills | Status: AC

## 2021-10-23 MED ORDER — ONDANSETRON HCL 4 MG PO TABS: 4.0000 mg | ORAL_TABLET | Freq: Three times a day (TID) | ORAL | 0 refills | Status: DC | PRN

## 2021-10-23 MED ORDER — OXYCODONE-ACETAMINOPHEN 5-325 MG PO TABS: 1.0000 | ORAL_TABLET | ORAL | 0 refills | Status: AC | PRN

## 2021-10-24 ENCOUNTER — Other Ambulatory Visit: Payer: Self-pay | Admitting: Podiatry

## 2021-10-24 ENCOUNTER — Telehealth: Payer: Self-pay | Admitting: Podiatry

## 2021-10-24 DIAGNOSIS — M21611 Bunion of right foot: Secondary | ICD-10-CM

## 2021-10-24 NOTE — Telephone Encounter (Signed)
Called patient back gave her the instructions Dr Ralene Cork gave , she will try the ibuprofen, if that does work then she will increase pain meds.

## 2021-10-24 NOTE — Telephone Encounter (Signed)
Patient wants to know if she can take more of her pain medication since the block wore off ? Patient states that her toe has been throbbing since midnight and the pain medication is not taking the edge off .  Please advise

## 2021-10-24 NOTE — Telephone Encounter (Signed)
She can take two at a time if neeed. She can also use ibuprofen in between doses for break through pain.

## 2021-10-26 ENCOUNTER — Other Ambulatory Visit: Payer: Self-pay | Admitting: Podiatry

## 2021-10-26 ENCOUNTER — Telehealth: Payer: Self-pay | Admitting: *Deleted

## 2021-10-26 MED ORDER — GABAPENTIN 300 MG PO CAPS: 300.0000 mg | ORAL_CAPSULE | Freq: Two times a day (BID) | ORAL | 0 refills | Status: DC

## 2021-10-26 NOTE — Telephone Encounter (Signed)
I will add on a different nerve medication that may help with the burning type pain. Prescription for gabapentin prescribed. Continue to ice elevate and may take ibuprofen in between doses to ease pain.  Please notify.

## 2021-10-26 NOTE — Telephone Encounter (Signed)
Called patient giving information that medication was sent to pharmacy and recommendations per physician,verbalized understanding.

## 2021-10-26 NOTE — Telephone Encounter (Signed)
Patient is calling , having severe burning pain after surgery on Tues., has doubled up on the oxycodone(now taking 2 tablets every 4 hrs), not helping quite enough. She stated that in the past her body does not respond well with the oxycodone.  please advise.

## 2021-10-31 ENCOUNTER — Ambulatory Visit (INDEPENDENT_AMBULATORY_CARE_PROVIDER_SITE_OTHER): Payer: Medicare Other | Admitting: Podiatry

## 2021-10-31 ENCOUNTER — Encounter: Payer: Self-pay | Admitting: Podiatry

## 2021-10-31 ENCOUNTER — Ambulatory Visit (INDEPENDENT_AMBULATORY_CARE_PROVIDER_SITE_OTHER): Payer: Medicare Other

## 2021-10-31 DIAGNOSIS — M2011 Hallux valgus (acquired), right foot: Secondary | ICD-10-CM

## 2021-10-31 DIAGNOSIS — Z9889 Other specified postprocedural states: Secondary | ICD-10-CM

## 2021-10-31 DIAGNOSIS — M21611 Bunion of right foot: Secondary | ICD-10-CM

## 2021-10-31 NOTE — Progress Notes (Signed)
Subjective:  Patient ID: Erika Davis, female    DOB: Aug 19, 1955,  MRN: [id number]  No chief complaint on file.   DOS:  10/23/21 Procedure: Right foot bunionectomy   66 y.o. female returns for POV#1.Overall doing well with some pain. Did have a fall on day #2. With a little swelling. Has been NWB in CAM boot.   Review of Systems: Negative except as noted in the HPI. Denies N/V/F/Ch.  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 PRN SOB, Disp: , Rfl:    alendronate (FOSAMAX) 70 MG tablet, TAKE 1 TABLET (70 MG TOTAL) BY MOUTH EVERY 7 (SEVEN) DAYS. TAKE WITH A FULL GLASS OF WATER ON AN EMPTY STOMACH., Disp: 12 tablet, Rfl: 2   ASPIRIN PO, Take 81 mg by mouth. , Disp: , Rfl:    atorvastatin (LIPITOR) 10 MG tablet, Take 10 mg by mouth daily., Disp: , Rfl:    beclomethasone (QVAR) 80 MCG/ACT inhaler, Inhale 1 puff into the lungs 2 (two) times daily., Disp: , Rfl:    cholecalciferol (VITAMIN D3) 25 MCG (1000 UNIT) tablet, Take 5,000 Units by mouth daily., Disp: , Rfl:    FLUoxetine (PROZAC) 40 MG capsule, Take 40 mg by mouth daily., Disp: , Rfl:    gabapentin (NEURONTIN) 300 MG capsule, Take 1 capsule (300 mg total) by mouth 2 (two) times daily., Disp: 30 capsule, Rfl: 0   hydrocortisone 1 % ointment, Apply 1 application topically 2 (two) times daily., Disp: 30 g, Rfl: 0   ibuprofen (ADVIL) 200 MG tablet, Take 3 tablets (600 mg total) by mouth every 6 (six) hours., Disp: , Rfl: 0   levocetirizine (XYZAL) 5 MG tablet, Take 5 mg by mouth every evening., Disp: , Rfl:    meloxicam (MOBIC) 15  MG tablet, Take 1 tablet (15 mg total) by mouth daily., Disp: 7 tablet, Rfl: 0   Multiple Vitamin (MULTIVITAMIN WITH MINERALS) TABS tablet, Take 1 tablet by mouth daily., Disp: , Rfl:    ondansetron (ZOFRAN) 4 MG tablet, Take 1 tablet (4 mg total) by mouth every 8 (eight) hours as needed for nausea or vomiting., Disp: 20 tablet, Rfl: 0   oxyCODONE-acetaminophen (PERCOCET) 5-325 MG tablet, Take 1 tablet by mouth every 6 (six) hours as needed., Disp: 11 tablet, Rfl: 0   traZODone (DESYREL) 50 MG tablet, TAKE 1 TABLET (50 MG TOTAL) BY MOUTH 3 TIMES/DAY AS NEEDED-BETWEEN MEALS & BEDTIME. (Patient taking differently: Take 50 mg by mouth at bedtime.), Disp: 30 tablet, Rfl: 1   vitamin B-12 (CYANOCOBALAMIN) 500 MCG tablet, Take 500 mcg by mouth daily., Disp: , Rfl:   Social History   Tobacco Use  Smoking Status Never  Smokeless Tobacco Never    No Known Allergies Objective:  There were no vitals filed for this visit. There is no height or weight on file to calculate BMI. Constitutional Well developed. Well nourished.  Vascular Foot warm and well perfused. Capillary refill normal to all digits.   Neurologic Normal speech. Oriented to person,  place, and time. Epicritic sensation to light touch grossly present bilaterally.  Dermatologic Skin healing well without signs of infection. Skin edges well coapted without signs of infection.  Orthopedic: Tenderness to palpation noted about the surgical site.   Radiographs: Hardware intact and toe well aligned.  Assessment:   1. Status post right foot surgery   2. Bunion of right foot    Plan:  Patient was evaluated and treated and all questions answered.  S/p foot surgery right -Progressing as expected post-operatively. -WB Status: NWB in CAM boot -Sutures: intact. -Medications: n/a -Foot redressed. Follow-up in 2 weeks for suture removal  No follow-ups on file.

## 2021-11-06 ENCOUNTER — Other Ambulatory Visit: Payer: Self-pay | Admitting: Podiatry

## 2021-11-14 ENCOUNTER — Encounter: Payer: Self-pay | Admitting: Podiatry

## 2021-11-14 ENCOUNTER — Ambulatory Visit: Payer: Medicare Other

## 2021-11-14 ENCOUNTER — Ambulatory Visit (INDEPENDENT_AMBULATORY_CARE_PROVIDER_SITE_OTHER): Payer: Medicare Other

## 2021-11-14 ENCOUNTER — Ambulatory Visit (INDEPENDENT_AMBULATORY_CARE_PROVIDER_SITE_OTHER): Payer: Medicare Other | Admitting: Podiatry

## 2021-11-14 DIAGNOSIS — M21611 Bunion of right foot: Secondary | ICD-10-CM

## 2021-11-14 DIAGNOSIS — Z9889 Other specified postprocedural states: Secondary | ICD-10-CM

## 2021-11-14 DIAGNOSIS — M2011 Hallux valgus (acquired), right foot: Secondary | ICD-10-CM | POA: Diagnosis not present

## 2021-11-14 NOTE — Progress Notes (Signed)
Subjective:  Patient ID: Erika Davis, female    DOB: 05-23-1956,  MRN: [id number]  No chief complaint on file.   DOS:  10/23/21 Procedure: Right foot bunionectomy   66 y.o. female returns for POV#2.Overall doing well with some pain.  Has been  in CAM boot but relates she has been putting weight on it mostly on the heel. Here today with cane and walking on foot. Not having any pain.    Review of Systems: Negative except as noted in the HPI. Denies N/V/F/Ch.  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 PRN SOB, Disp: , Rfl:    alendronate (FOSAMAX) 70 MG tablet, TAKE 1 TABLET (70 MG TOTAL) BY MOUTH EVERY 7 (SEVEN) DAYS. TAKE WITH A FULL GLASS OF WATER ON AN EMPTY STOMACH., Disp: 12 tablet, Rfl: 2   ASPIRIN PO, Take 81 mg by mouth. , Disp: , Rfl:    atorvastatin (LIPITOR) 10 MG tablet, Take 10 mg by mouth daily., Disp: , Rfl:    beclomethasone (QVAR) 80 MCG/ACT inhaler, Inhale 1 puff into the lungs 2 (two) times daily., Disp: , Rfl:    cholecalciferol (VITAMIN D3) 25 MCG (1000 UNIT) tablet, Take 5,000 Units by mouth daily., Disp: , Rfl:    FLUoxetine (PROZAC) 40 MG capsule, Take 40 mg by mouth daily., Disp: , Rfl:    gabapentin (NEURONTIN) 300 MG capsule, Take 1 capsule (300 mg total) by mouth 2 (two) times daily., Disp: 30 capsule, Rfl: 0   hydrocortisone 1 % ointment, Apply 1 application topically 2 (two) times daily., Disp: 30 g, Rfl: 0   ibuprofen (ADVIL) 200 MG tablet, Take 3 tablets (600 mg total) by mouth every 6 (six) hours., Disp: , Rfl: 0   levocetirizine (XYZAL) 5 MG  tablet, Take 5 mg by mouth every evening., Disp: , Rfl:    meloxicam (MOBIC) 15 MG tablet, Take 1 tablet (15 mg total) by mouth daily., Disp: 7 tablet, Rfl: 0   Multiple Vitamin (MULTIVITAMIN WITH MINERALS) TABS tablet, Take 1 tablet by mouth daily., Disp: , Rfl:    ondansetron (ZOFRAN) 4 MG tablet, Take 1 tablet (4 mg total) by mouth every 8 (eight) hours as needed for nausea or vomiting., Disp: 20 tablet, Rfl: 0   oxyCODONE-acetaminophen (PERCOCET) 5-325 MG tablet, Take 1 tablet by mouth every 6 (six) hours as needed., Disp: 11 tablet, Rfl: 0   traZODone (DESYREL) 50 MG tablet, TAKE 1 TABLET (50 MG TOTAL) BY MOUTH 3 TIMES/DAY AS NEEDED-BETWEEN MEALS & BEDTIME. (Patient taking differently: Take 50 mg by mouth at bedtime.), Disp: 30 tablet, Rfl: 1   vitamin B-12 (CYANOCOBALAMIN) 500 MCG tablet, Take 500 mcg by mouth daily., Disp: , Rfl:   Social History   Tobacco Use  Smoking Status Never  Smokeless Tobacco Never    No Known Allergies Objective:  There were no vitals filed for this visit. There is no height or weight on file to calculate BMI. Constitutional Well developed. Well nourished.  Vascular Foot warm and  well perfused. Capillary refill normal to all digits.   Neurologic Normal speech. Oriented to person, place, and time. Epicritic sensation to light touch grossly present bilaterally.  Dermatologic Skin healing well without signs of infection. Skin edges well coapted without signs of infection.  Orthopedic: Tenderness to palpation noted about the surgical site.   Radiographs: Hardware intact and toe well aligned.  Assessment:   1. Status post right foot surgery   2. Bunion of right foot    Plan:  Patient was evaluated and treated and all questions answered.  S/p foot surgery right -Progressing as expected post-operatively. -WB Status: NWB for one more week. Than may begin to WBAT in CAM boot.  -Sutures: removed without incident.  -Medications: n/a -Foot redressed.  May begin showering tomorrow as normal. Avoid soaking for next week.  Follow-up in 3 weeks for recheck   No follow-ups on file.

## 2021-12-05 ENCOUNTER — Encounter: Payer: Self-pay | Admitting: Podiatry

## 2021-12-05 ENCOUNTER — Ambulatory Visit (INDEPENDENT_AMBULATORY_CARE_PROVIDER_SITE_OTHER): Payer: Medicare Other | Admitting: Podiatry

## 2021-12-05 DIAGNOSIS — M21611 Bunion of right foot: Secondary | ICD-10-CM

## 2021-12-05 DIAGNOSIS — Z9889 Other specified postprocedural states: Secondary | ICD-10-CM

## 2021-12-05 NOTE — Progress Notes (Signed)
Subjective:  Patient ID: Erika Davis, female    DOB: [birth year],  MRN: [id number]  Chief Complaint  Patient presents with   Routine Post Op    Right bunionectomy follow up. Pt states she is doing great.     DOS:  10/23/21 Procedure: Right foot bunionectomy   66 y.o. female returns for POV#3.Overall doing very well. No pain and has even transitioned into regular shoes with no issues. No complaints today.   Review of Systems: Negative except as noted in the HPI. Denies N/V/F/Ch.  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 PRN SOB, Disp: , Rfl:    alendronate (FOSAMAX) 70 MG tablet, TAKE 1 TABLET (70 MG TOTAL) BY MOUTH EVERY 7 (SEVEN) DAYS. TAKE WITH A FULL GLASS OF WATER ON AN EMPTY STOMACH., Disp: 12 tablet, Rfl: 2   ASPIRIN PO, Take 81 mg by mouth. , Disp: , Rfl:    atorvastatin (LIPITOR) 10 MG tablet, Take 10 mg by mouth daily., Disp: , Rfl:    beclomethasone (QVAR) 80 MCG/ACT inhaler, Inhale 1 puff into the lungs 2 (two) times daily., Disp: , Rfl:    cholecalciferol (VITAMIN D3) 25 MCG (1000 UNIT) tablet, Take 5,000 Units by mouth daily., Disp: , Rfl:    FLUoxetine (PROZAC) 40 MG capsule, Take 40 mg by mouth daily., Disp: , Rfl:    gabapentin (NEURONTIN) 300 MG capsule, Take 1 capsule (300 mg total) by mouth 2 (two) times daily., Disp: 30 capsule, Rfl: 0   hydrocortisone 1 % ointment, Apply 1 application topically 2 (two) times daily., Disp: 30 g, Rfl: 0   ibuprofen (ADVIL) 200 MG tablet, Take 3 tablets (600 mg total) by mouth every 6 (six) hours., Disp: , Rfl: 0    levocetirizine (XYZAL) 5 MG tablet, Take 5 mg by mouth every evening., Disp: , Rfl:    meloxicam (MOBIC) 15 MG tablet, Take 1 tablet (15 mg total) by mouth daily., Disp: 7 tablet, Rfl: 0   Multiple Vitamin (MULTIVITAMIN WITH MINERALS) TABS tablet, Take 1 tablet by mouth daily., Disp: , Rfl:    ondansetron (ZOFRAN) 4 MG tablet, Take 1 tablet (4 mg total) by mouth every 8 (eight) hours as needed for nausea or vomiting., Disp: 20 tablet, Rfl: 0   oxyCODONE-acetaminophen (PERCOCET) 5-325 MG tablet, Take 1 tablet by mouth every 6 (six) hours as needed., Disp: 11 tablet, Rfl: 0   traZODone (DESYREL) 50 MG tablet, TAKE 1 TABLET (50 MG TOTAL) BY MOUTH 3 TIMES/DAY AS NEEDED-BETWEEN MEALS & BEDTIME. (Patient taking differently: Take 50 mg by mouth at bedtime.), Disp: 30 tablet, Rfl: 1   vitamin B-12 (CYANOCOBALAMIN) 500 MCG tablet, Take 500 mcg by mouth daily., Disp: , Rfl:   Social History   Tobacco Use  Smoking Status Never  Smokeless Tobacco Never    No Known Allergies Objective:  There were no vitals filed for this visit. There is no height or weight on file to calculate BMI. Constitutional Well developed. Well nourished.  Vascular Foot warm  and well perfused. Capillary refill normal to all digits.   Neurologic Normal speech. Oriented to person, place, and time. Epicritic sensation to light touch grossly present bilaterally.  Dermatologic Skin healing well without signs of infection. Skin edges well coapted without signs of infection.  Orthopedic: Tenderness to palpation noted about the surgical site.   Radiographs: Hardware intact and toe well aligned.  Assessment:   1. Status post right foot surgery   2. Bunion of right foot     Plan:  Patient was evaluated and treated and all questions answered.  S/p foot surgery right -Progressing as expected post-operatively. -WB Status: WBAT in regular shoes.  May return to normal activities.  Discharges at this point. Return as needed.    No follow-ups on file.

## 2021-12-30 ENCOUNTER — Other Ambulatory Visit: Payer: Self-pay | Admitting: Obstetrics & Gynecology

## 2021-12-31 NOTE — Telephone Encounter (Signed)
Last annual exam 02/2021 

## 2022-02-12 ENCOUNTER — Other Ambulatory Visit: Payer: Self-pay | Admitting: Podiatry

## 2022-02-12 NOTE — Telephone Encounter (Signed)
Will need follow up

## 2022-02-12 NOTE — Telephone Encounter (Signed)
Please advise 

## 2022-02-13 NOTE — Telephone Encounter (Signed)
Please schedule f/u as well.

## 2022-02-14 ENCOUNTER — Telehealth: Payer: Self-pay | Admitting: Podiatry

## 2022-02-14 NOTE — Telephone Encounter (Signed)
Lvm for pt to callback and schedule a medication follow up appt with Dr. Ralene Cork. I advised pt that medication gabepentin was sent to pharmacy on 02/13/22. Dr. Ralene Cork advised that she follow up for any future refills.

## 2022-02-25 ENCOUNTER — Encounter: Payer: Self-pay | Admitting: Nurse Practitioner

## 2022-02-25 ENCOUNTER — Other Ambulatory Visit (HOSPITAL_COMMUNITY)
Admission: RE | Admit: 2022-02-25 | Discharge: 2022-02-25 | Disposition: A | Discharge status: Home / Self Care | Payer: Medicare Other | Source: Ambulatory Visit | Attending: Nurse Practitioner | Admitting: Nurse Practitioner

## 2022-02-25 ENCOUNTER — Ambulatory Visit (INDEPENDENT_AMBULATORY_CARE_PROVIDER_SITE_OTHER): Payer: Medicare Other | Admitting: Nurse Practitioner

## 2022-02-25 VITALS — BP 102/62 | HR 90 | Ht 62.25 in | Wt 150.0 lb

## 2022-02-25 DIAGNOSIS — Z1151 Encounter for screening for human papillomavirus (HPV): Secondary | ICD-10-CM | POA: Insufficient documentation

## 2022-02-25 DIAGNOSIS — M810 Age-related osteoporosis without current pathological fracture: Secondary | ICD-10-CM

## 2022-02-25 DIAGNOSIS — Z780 Asymptomatic menopausal state: Secondary | ICD-10-CM

## 2022-02-25 DIAGNOSIS — Z01419 Encounter for gynecological examination (general) (routine) without abnormal findings: Secondary | ICD-10-CM | POA: Insufficient documentation

## 2022-02-25 MED ORDER — ALENDRONATE SODIUM 70 MG PO TABS: 70.0000 mg | ORAL_TABLET | ORAL | 2 refills | Status: AC

## 2022-02-25 NOTE — Addendum Note (Signed)
Addended byMarny Lowenstein on: 02/25/2022 12:10 PM   Modules accepted: Orders

## 2022-02-25 NOTE — Progress Notes (Addendum)
ADALYNNE STEFFENSMEIER June 28, 1955 [id number]   History:  66 y.o. [id number] presents for breast and pelvic exam without GYN complaints. Postmenopausal - no HRT, no bleeding. Normal pap and mammogram history. History of osteoporosis, was on Fosamax for 5 years, stopped for 1 year and was restarted 09/2020. ORIF of right wrist 02/10/2021 following a fall. Option to switch treatment discussed last year, but she wanted to continue with Fosamax.   Gynecologic History No LMP recorded. Patient is postmenopausal.   Sexually active: Yes  Health maintenance Last Pap: 02/04/2018. Results were: Normal, 5-year repeat Last mammogram: 08/2021. Results were: Normal Last colonoscopy: 2018. Results were: polyp, 7-year recall Last Dexa: 08/22/2020. Results were: T-score -2.3  Past medical history, past surgical history, family history and social history were all reviewed and documented in the EPIC chart. Married. Retired from [employer] development [employer]. 2 children, 4 grandchildren. Mother with history of uterine cancer.   ROS:  A ROS was performed and pertinent positives and negatives are included.  Exam:  Vitals:   02/25/22 1104  BP: 102/62  Pulse: 90  SpO2: 97%  Weight: 150 lb (68 kg)  Height: 5' 2.25" (1.581 m)     Body mass index is 27.22 kg/m.  General appearance:  Normal Thyroid:  Symmetrical, normal in size, without palpable masses or nodularity. Respiratory  Auscultation:  Clear without wheezing or rhonchi Cardiovascular  Auscultation:  Regular rate, without rubs, murmurs or gallops  Edema/varicosities:  Not grossly evident Abdominal  Soft,nontender, without masses, guarding or rebound.  Liver/spleen:  No organomegaly noted  Hernia:  None appreciated  Skin  Inspection:  Grossly normal   Breasts: Examined lying and sitting.   Right: Without masses, retractions, discharge or axillary adenopathy.   Left: Without masses, retractions, discharge or axillary adenopathy. Genitourinary   Inguinal/mons:   Normal without inguinal adenopathy  External genitalia:  Normal appearing vulva with no masses, tenderness, or lesions  BUS/Urethra/Skene's glands:  Normal  Vagina:  Normal appearing with normal color and discharge, no lesions. Atrophic changes  Cervix:  Normal appearing without discharge or lesions  Uterus:  Normal in size, shape and contour.  Midline and mobile, nontender  Adnexa/parametria:     Rt: Normal in size, without masses or tenderness.   Lt: Normal in size, without masses or tenderness.  Anus and perineum: Normal  Digital rectal exam: Normal sphincter tone without palpated masses or tenderness  Patient informed chaperone available to be present for breast and pelvic exam. Patient has requested no chaperone to be present. Patient has been advised what will be completed during breast and pelvic exam.   Assessment/Plan:  66 y.o. [id number] for breast and pelvic exam.   Well female exam with routine gynecological exam - Plan: Cytology - PAP( Christmas). Education provided on SBEs, importance of preventative screenings, current guidelines, high calcium diet, regular exercise, and multivitamin daily.  Labs with PCP.   Age-related osteoporosis without current pathological fracture - Plan: alendronate (FOSAMAX) 70 MG tablet weekly. Was on Fosamax for 5 years, stopped for 1 year and restarted 09/2020. ORIF of right wrist 02/09/2021 following a fall. Most recent T-score -2.3 in March 2022. We did discuss option of switching treatment after recent traumatic fracture last but she wants to continue Fosamax. Will repeat DXA in March.   Postmenopausal - no HRT, no bleeding.   Screening for cervical cancer - Normal Pap history. Pap today. If normal, we discussed option to stop screenings and she is agreeable.   Screening for breast cancer -  Normal mammogram history.  Continue annual screenings.  Normal breast exam today.  Screening for colon cancer - 2018 colonoscopy. Will repeat at GI's recommended  interval.   Follow up in 2 years for breast and pelvic exam.      Olivia Mackie [facility], 11:15 AM 02/25/2022

## 2022-02-27 ENCOUNTER — Encounter: Payer: Self-pay | Admitting: Nurse Practitioner

## 2022-02-28 LAB — CYTOLOGY - PAP
Comment: NEGATIVE
Diagnosis: NEGATIVE
High risk HPV: NEGATIVE

## 2022-03-04 ENCOUNTER — Encounter: Payer: Self-pay | Admitting: Podiatry

## 2022-03-04 ENCOUNTER — Ambulatory Visit (INDEPENDENT_AMBULATORY_CARE_PROVIDER_SITE_OTHER): Payer: Medicare Other | Admitting: Podiatry

## 2022-03-04 DIAGNOSIS — Z9889 Other specified postprocedural states: Secondary | ICD-10-CM

## 2022-03-04 DIAGNOSIS — M21611 Bunion of right foot: Secondary | ICD-10-CM

## 2022-03-04 NOTE — Progress Notes (Signed)
Subjective:  Patient ID: Erika Davis, female    DOB: August 10, 1955,  MRN: [id number]  Chief Complaint  Patient presents with   Routine Post Op    Patient states foot is better since the last visit .Marland Kitchen No pain no swelling  patient states she doesn't have a big callus overall she is happy with her results    DOS:  10/23/21 Procedure: Right foot bunionectomy   66 y.o. female returns for POV#4.Overall doing very well. No pain and has even transitioned into regular shoes with no issues. No complaints today.   Review of Systems: Negative except as noted in the HPI. Denies N/V/F/Ch.  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 PRN SOB, Disp: , Rfl:    alendronate (FOSAMAX) 70 MG tablet, Take 1 tablet (70 mg total) by mouth every 7 (seven) days. Take with a full glass of water on an empty stomach., Disp: 12 tablet, Rfl: 2   ASPIRIN PO, Take 81 mg by mouth. , Disp: , Rfl:    atorvastatin (LIPITOR) 10 MG tablet, Take 10 mg by mouth daily., Disp: , Rfl:    beclomethasone (QVAR) 80 MCG/ACT inhaler, Inhale 1 puff into the lungs 2 (two) times daily., Disp: , Rfl:    cholecalciferol (VITAMIN D3) 25 MCG (1000 UNIT) tablet, Take 5,000 Units by mouth daily., Disp: , Rfl:    FLUoxetine (PROZAC) 40 MG capsule, Take 40 mg by mouth daily., Disp: , Rfl:    IBUPROFEN PO, Take by mouth., Disp: , Rfl:    levocetirizine (XYZAL) 5 MG tablet, Take 5 mg by mouth every evening., Disp: , Rfl:    Multiple Vitamin (MULTIVITAMIN WITH MINERALS) TABS tablet, Take 1 tablet by mouth daily., Disp: , Rfl:    traZODone  (DESYREL) 50 MG tablet, TAKE 1 TABLET (50 MG TOTAL) BY MOUTH 3 TIMES/DAY AS NEEDED-BETWEEN MEALS & BEDTIME. (Patient taking differently: Take 50 mg by mouth at bedtime.), Disp: 30 tablet, Rfl: 1   vitamin B-12 (CYANOCOBALAMIN) 500 MCG tablet, Take 500 mcg by mouth daily., Disp: , Rfl:    VITAMIN D PO, Take by mouth., Disp: , Rfl:   Social History   Tobacco Use  Smoking Status Never  Smokeless Tobacco Never    No Known Allergies Objective:  There were no vitals filed for this visit. There is no height or weight on file to calculate BMI. Constitutional Well developed. Well nourished.  Vascular Foot warm and well perfused. Capillary refill normal to all digits.   Neurologic Normal speech. Oriented to person, place, and time. Epicritic sensation to light touch grossly present bilaterally.  Dermatologic Skin healing well without signs of infection. Skin edges well coapted without signs of infection.  Orthopedic: Tenderness to palpation noted about the surgical site.   Radiographs: Hardware intact and toe well aligned.  Assessment:   1. Status post right foot surgery   2. Bunion of right foot      Plan:  Patient was evaluated and treated and all questions answered.  S/p foot surgery right -Progressing as expected post-operatively. -WB Status: WBAT in regular shoes.  May return to normal activities.  Discharges at this point. Return as needed.   Return if symptoms worsen or fail to improve.

## 2022-07-31 IMAGING — DX DG HAND COMPLETE 3+V*R*
1 series · 3 of 3 positions shown · non-contrast
Comparison: No priors.

CLINICAL DATA: 64-year-old female with history of trauma from a
fall. Wrist pain.

EXAM:
RIGHT HAND - COMPLETE 3+ VIEW; RIGHT WRIST - COMPLETE 3+ VIEW

[Series 1: hand · 0.14mm/px · 3 of 3 slices shown]
[im 1/3]
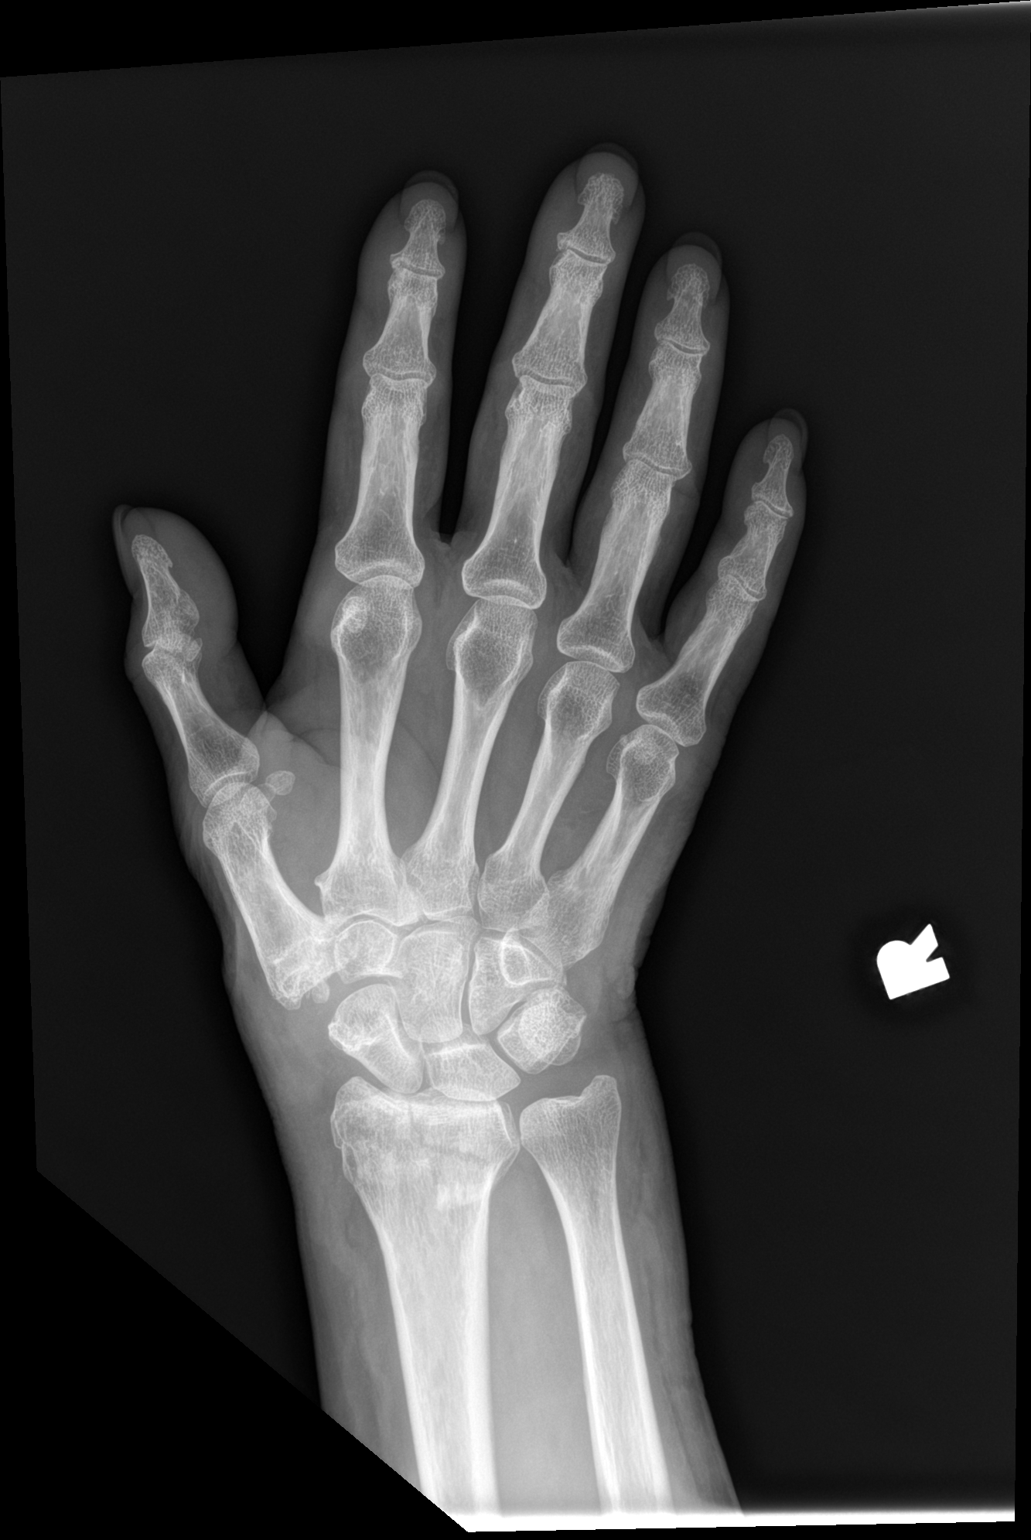
[im 2/3]
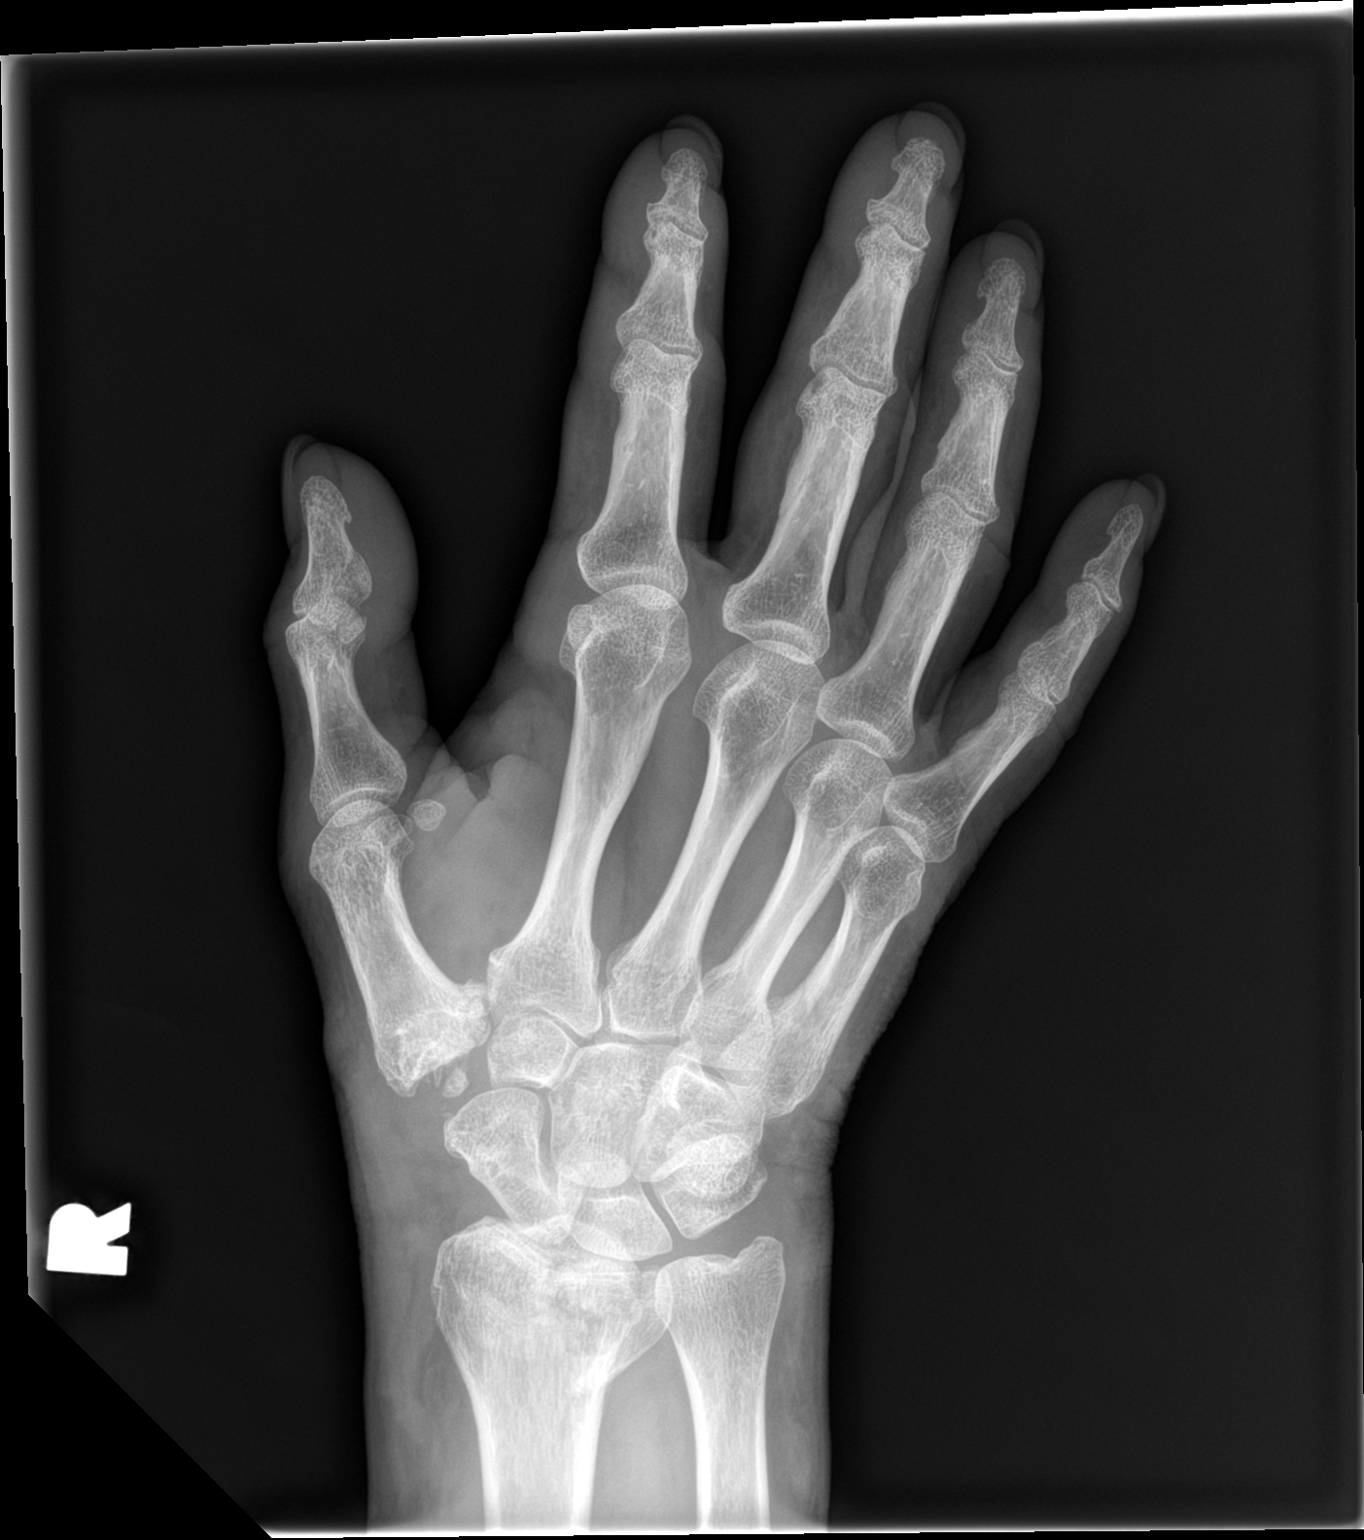
[im 3/3]
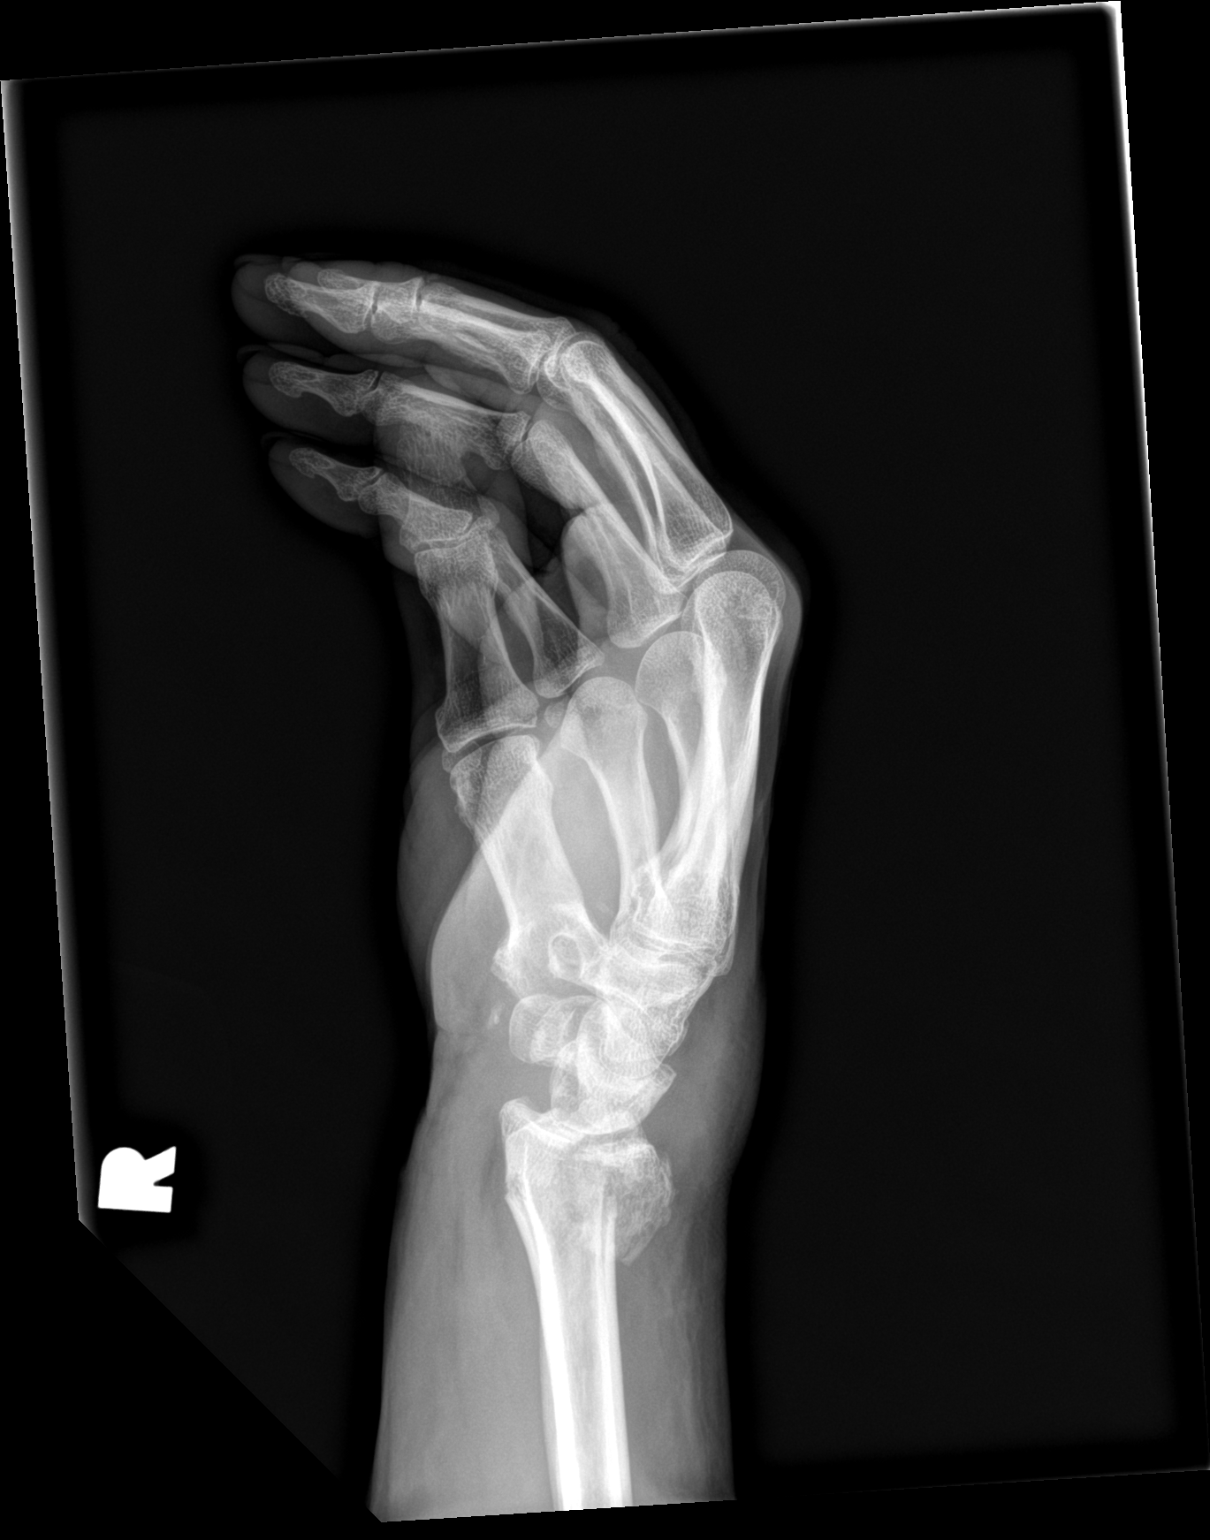

[3 of 3 positions shown; findings below may reference images not displayed]

FINDINGS: Three views of the right wrist demonstrate an acute mildly
comminuted intra-articular fracture of the distal radius which
appears mildly impacted, with some mild posterior displacement of
fracture fragments appreciated on the lateral projection.
Approximately 15 degrees of dorsal angulation is also noted. No
other acute displaced fracture or dislocation is appreciated.
Degenerative changes are noted, most evident at the first carpal
metacarpal joint where there is near complete absence of the
trapezium. Soft tissue surrounding the wrist joint are diffusely
swollen.
IMPRESSION: 1. Acute mildly comminuted dorsally angulated impacted fracture of
the distal radius, as detailed above.
2. Degenerative changes at the first CMC joint where there is near
complete absence of the trapezium which could be posttraumatic
(remote) or postsurgical.

## 2022-07-31 IMAGING — DX DG WRIST COMPLETE 3+V*R*
1 series · 4 of 4 positions shown · non-contrast
Comparison: No priors.

CLINICAL DATA: 64-year-old female with history of trauma from a
fall. Wrist pain.

EXAM:
RIGHT HAND - COMPLETE 3+ VIEW; RIGHT WRIST - COMPLETE 3+ VIEW

[Series 1: wrist · 0.14mm/px · 4 of 4 slices shown]
[im 1/4]
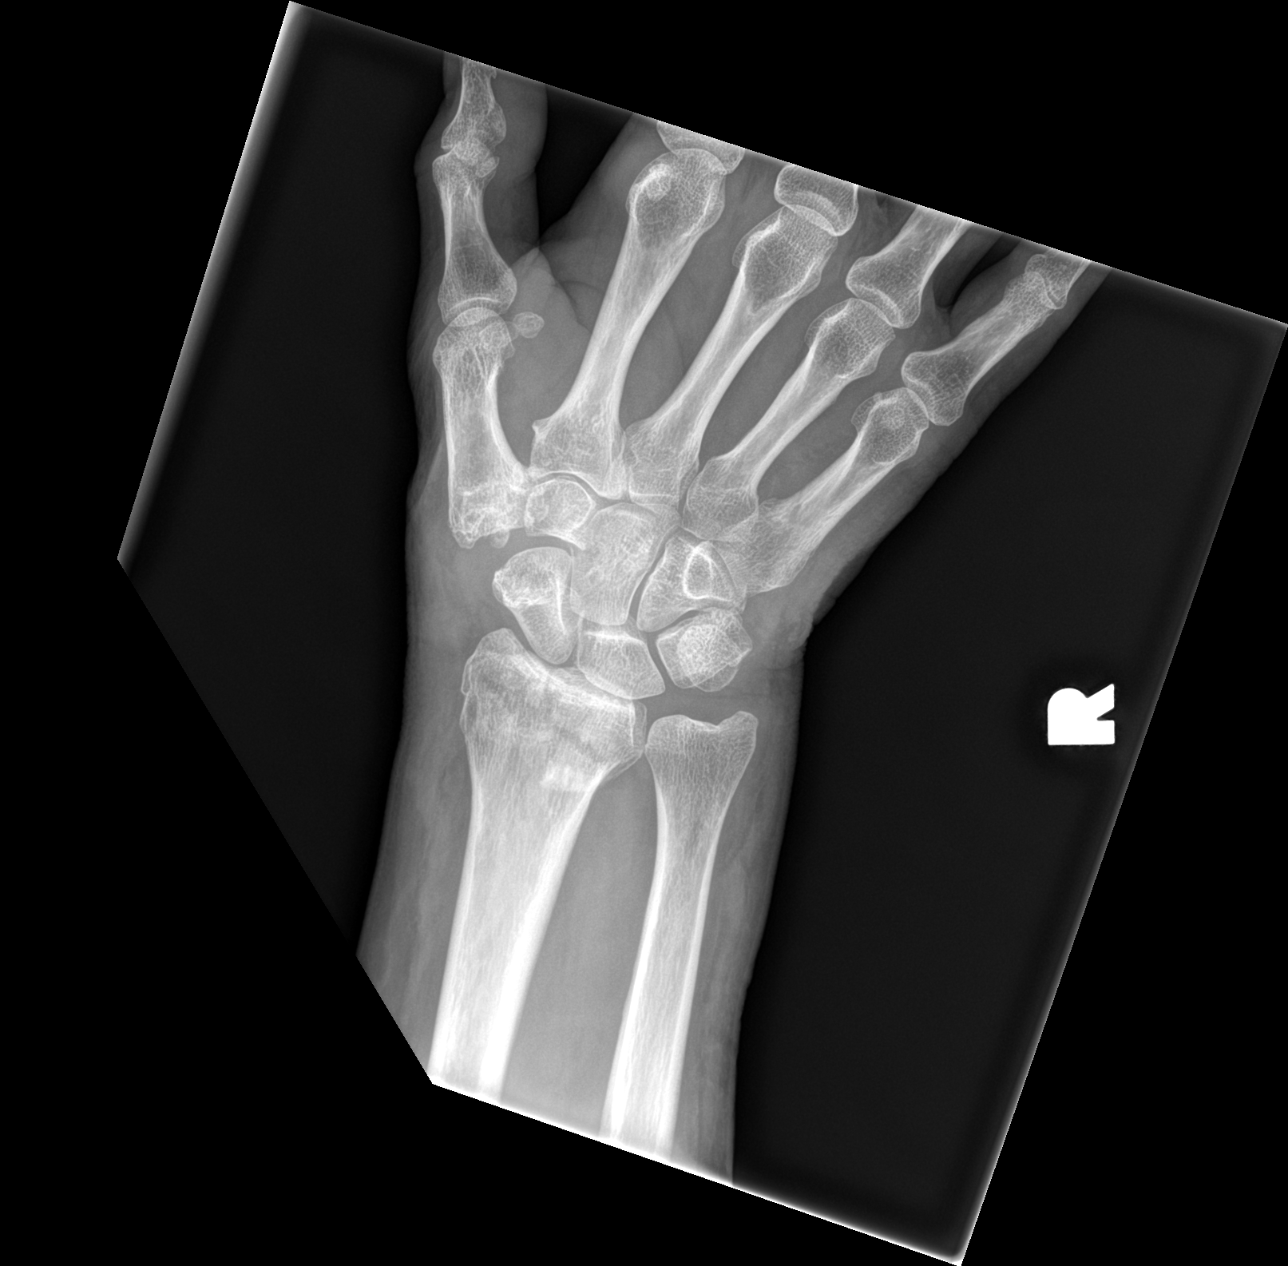
[im 2/4]
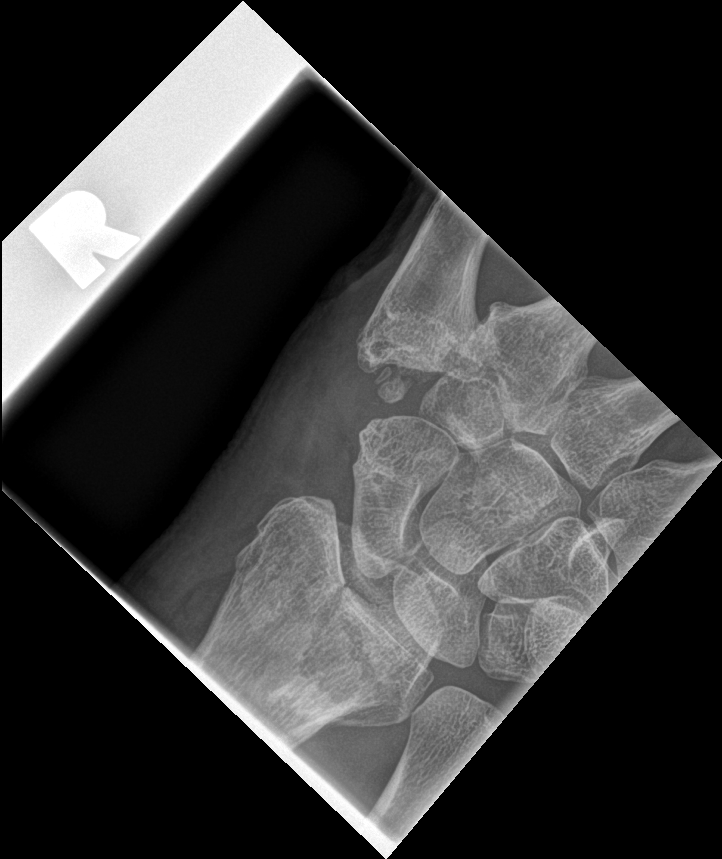
[im 3/4]
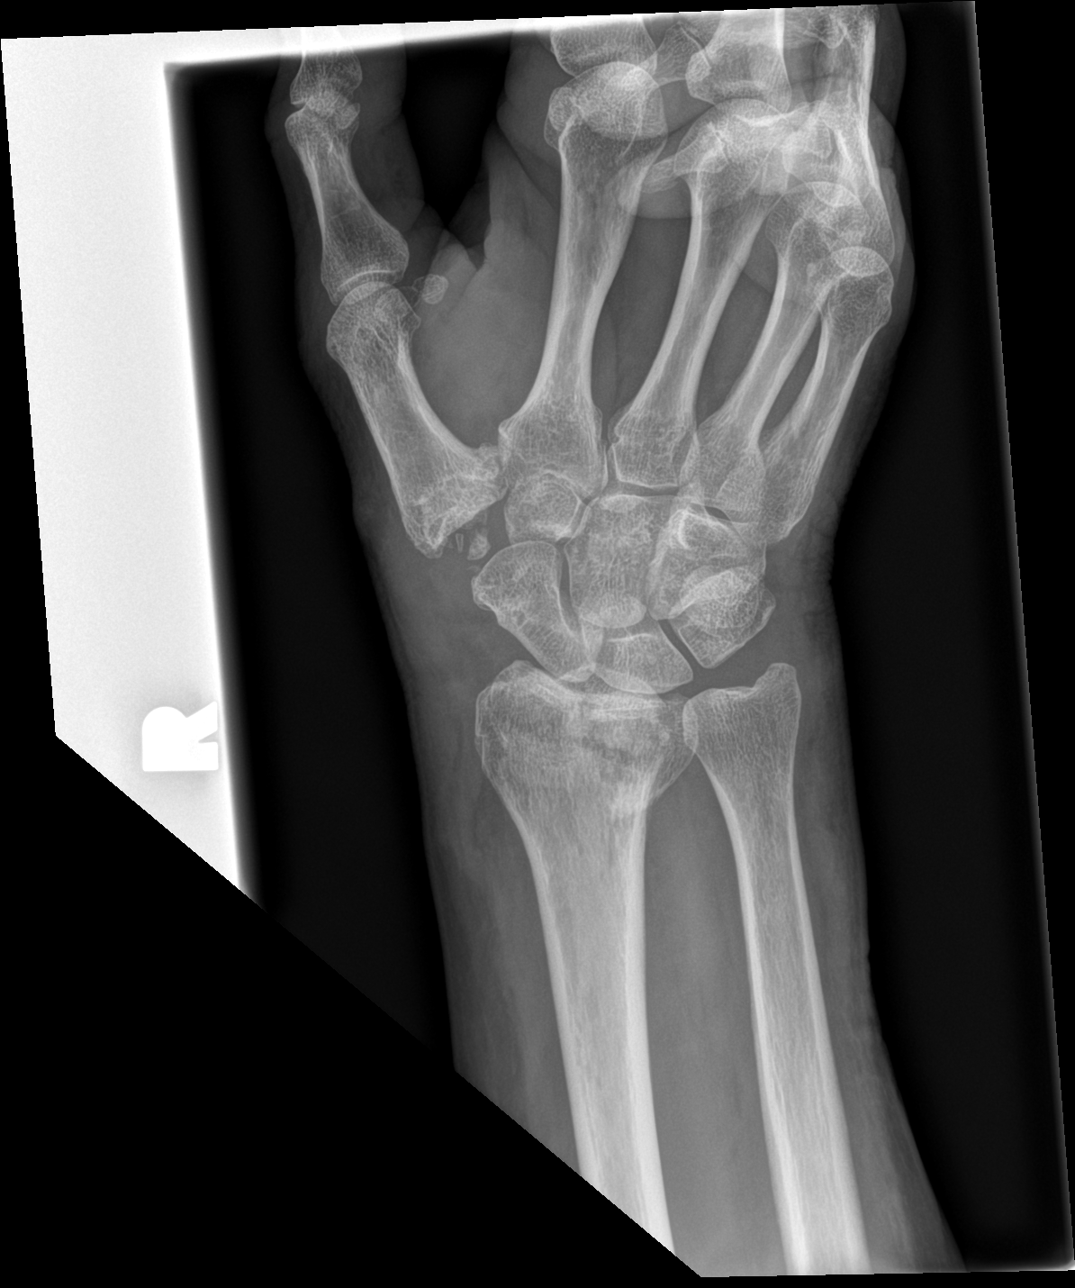
[im 4/4]
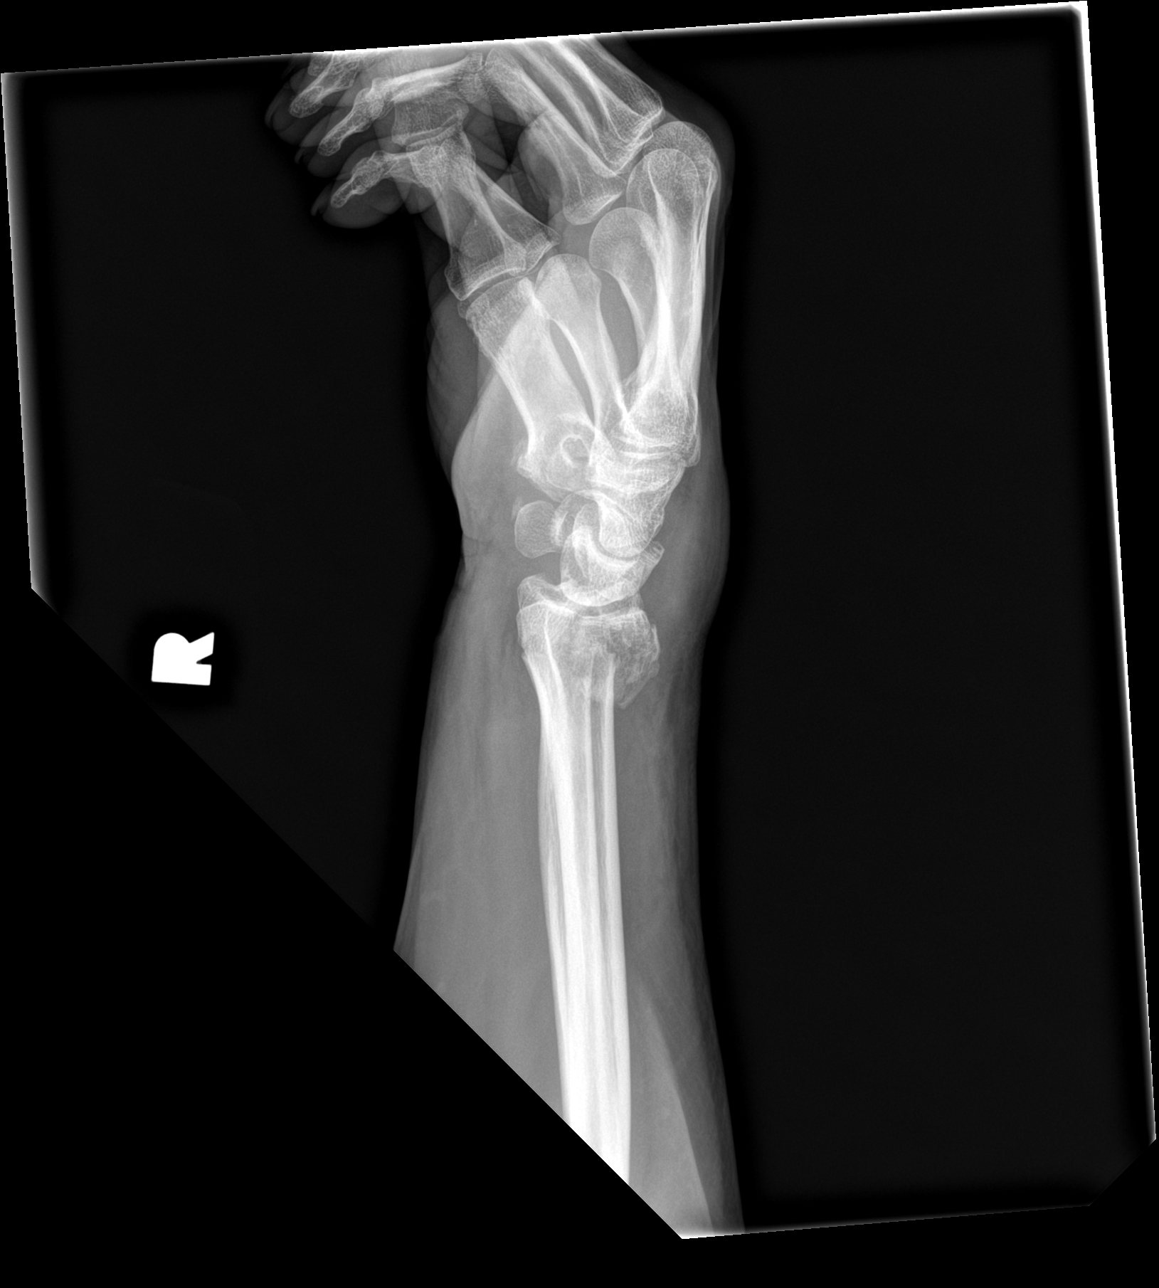

[4 of 4 positions shown; findings below may reference images not displayed]

FINDINGS: Three views of the right wrist demonstrate an acute mildly
comminuted intra-articular fracture of the distal radius which
appears mildly impacted, with some mild posterior displacement of
fracture fragments appreciated on the lateral projection.
Approximately 15 degrees of dorsal angulation is also noted. No
other acute displaced fracture or dislocation is appreciated.
Degenerative changes are noted, most evident at the first carpal
metacarpal joint where there is near complete absence of the
trapezium. Soft tissue surrounding the wrist joint are diffusely
swollen.
IMPRESSION: 1. Acute mildly comminuted dorsally angulated impacted fracture of
the distal radius, as detailed above.
2. Degenerative changes at the first CMC joint where there is near
complete absence of the trapezium which could be posttraumatic
(remote) or postsurgical.

## 2022-08-19 ENCOUNTER — Encounter: Payer: Self-pay | Admitting: Nurse Practitioner

## 2022-08-27 ENCOUNTER — Other Ambulatory Visit: Payer: Self-pay | Admitting: Nurse Practitioner

## 2022-08-27 ENCOUNTER — Other Ambulatory Visit: Payer: Self-pay

## 2022-08-27 ENCOUNTER — Ambulatory Visit (INDEPENDENT_AMBULATORY_CARE_PROVIDER_SITE_OTHER): Payer: Medicare Other

## 2022-08-27 DIAGNOSIS — Z780 Asymptomatic menopausal state: Secondary | ICD-10-CM | POA: Diagnosis not present

## 2022-08-27 DIAGNOSIS — M8589 Other specified disorders of bone density and structure, multiple sites: Secondary | ICD-10-CM

## 2022-08-27 DIAGNOSIS — Z13820 Encounter for screening for osteoporosis: Secondary | ICD-10-CM

## 2022-08-27 DIAGNOSIS — M8580 Other specified disorders of bone density and structure, unspecified site: Secondary | ICD-10-CM

## 2024-04-06 ENCOUNTER — Other Ambulatory Visit: Payer: Self-pay | Admitting: Physician Assistant

## 2024-04-06 DIAGNOSIS — M810 Age-related osteoporosis without current pathological fracture: Secondary | ICD-10-CM

## 2024-05-06 ENCOUNTER — Emergency Department (HOSPITAL_BASED_OUTPATIENT_CLINIC_OR_DEPARTMENT_OTHER)
Admission: EM | Admit: 2024-05-06 | Discharge: 2024-05-06 | Disposition: A | Discharge status: Home / Self Care | Attending: Emergency Medicine | Admitting: Emergency Medicine

## 2024-05-06 ENCOUNTER — Encounter (HOSPITAL_BASED_OUTPATIENT_CLINIC_OR_DEPARTMENT_OTHER): Payer: Self-pay | Admitting: Emergency Medicine

## 2024-05-06 ENCOUNTER — Other Ambulatory Visit: Payer: Self-pay

## 2024-05-06 DIAGNOSIS — Z79899 Other long term (current) drug therapy: Secondary | ICD-10-CM | POA: Insufficient documentation

## 2024-05-06 DIAGNOSIS — G8918 Other acute postprocedural pain: Secondary | ICD-10-CM | POA: Insufficient documentation

## 2024-05-06 DIAGNOSIS — Z7982 Long term (current) use of aspirin: Secondary | ICD-10-CM | POA: Insufficient documentation

## 2024-05-06 DIAGNOSIS — M79642 Pain in left hand: Secondary | ICD-10-CM | POA: Diagnosis not present

## 2024-05-06 MED ORDER — OXYCODONE HCL 5 MG PO TABS
5.0000 mg | ORAL_TABLET | ORAL | 0 refills | Status: AC | PRN
Start: 2024-05-06 — End: ?

## 2024-05-06 MED ORDER — OXYCODONE HCL 5 MG PO TABS
5.0000 mg | ORAL_TABLET | ORAL | Status: DC
  Filled 2024-05-06: qty 1

## 2024-05-06 MED ORDER — HYDROMORPHONE HCL 1 MG/ML IJ SOLN
1.0000 mg | Freq: Once | INTRAMUSCULAR | Status: AC
  Administered 2024-05-06: 1 mg via INTRAVENOUS
  Filled 2024-05-06: qty 1

## 2024-05-06 MED ORDER — HYDROMORPHONE HCL 1 MG/ML IJ SOLN
0.5000 mg | Freq: Once | INTRAMUSCULAR | Status: AC
  Administered 2024-05-06: 0.5 mg via INTRAVENOUS
  Filled 2024-05-06: qty 1

## 2024-05-06 NOTE — ED Notes (Signed)
 Xeroform was placed on patient left incision before thumb spica splint was placed.

## 2024-05-06 NOTE — ED Notes (Signed)
 DC paperwork given and verbally understood.

## 2024-05-06 NOTE — ED Notes (Signed)
 Rt asked to look at patient due to desat after pain meds. When I arrived, patient was already on 2L and SAT 100%. No distress noted. Will monitor as needed

## 2024-05-06 NOTE — ED Triage Notes (Signed)
 Pt endorses LT thumb surgery yesterday. Nerve block, pain since block wore off at 0100 last night. Pain meds not working. Hydrocodone 5/325 plus 500mg  tylenol  pta with no relief

## 2024-05-06 NOTE — ED Provider Notes (Signed)
 [place] EMERGENCY DEPARTMENT AT [facility] Provider Note   CSN: [id number] Arrival date & time: 05/06/24  9044     Patient presents with: Post-op Problem and Hand Pain   Erika Davis is Davis 68 y.o. female.   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  Says that she is having pain at the site of the surgical incision and at the base of her thumb.  Says that the nerve block wore off and she is been taking Norco but the pain has persisted.  Feels like Davis tightness.  Says it is currently 9/10 in severity.  No numbness or weakness of the hand otherwise.       Prior to Admission medications   Medication Sig Start Date End Date Taking? Authorizing Provider  oxyCODONE  (ROXICODONE ) 5 MG immediate release tablet Take 1 tablet (5 mg total) by mouth every 4 (four) hours as needed for severe pain (pain score 7-10). 05/06/24  Yes Erika Lamar BROCKS, MD  ALBUTEROL IN Inhale into the lungs. PRN SOB    [provider]  alendronate  (FOSAMAX ) 70 MG tablet Take 1 tablet (70 mg total) by mouth every 7 (seven) days. Take with Davis full glass of water on an empty stomach. 02/25/22   Erika Riggs A, NP  ASPIRIN PO Take 81 mg by mouth.     [provider]  atorvastatin (LIPITOR) 10 MG tablet Take 10 mg by mouth daily.    [provider]  beclomethasone (QVAR) 80 MCG/ACT inhaler Inhale 1 puff into the lungs 2 (two) times daily.    [provider]  cholecalciferol (VITAMIN D3) 25 MCG (1000 UNIT) tablet Take 5,000 Units by mouth daily.    [provider]  FLUoxetine  (PROZAC ) 40 MG capsule Take 40 mg by mouth daily. 01/28/21   [provider]  IBUPROFEN  PO Take by mouth.    [provider]  levocetirizine (XYZAL) 5 MG tablet Take 5 mg by mouth every evening.    [provider]  Multiple Vitamin (MULTIVITAMIN  WITH MINERALS) TABS tablet Take 1 tablet by mouth daily.    [provider]  traZODone  (DESYREL ) 50 MG tablet TAKE 1 TABLET (50 MG TOTAL) BY MOUTH 3 TIMES/DAY AS NEEDED-BETWEEN MEALS & BEDTIME. Patient taking differently: Take 50 mg by mouth at bedtime. 07/20/18   Erika Inocente PARAS, NP  vitamin B-12 (CYANOCOBALAMIN) 500 MCG tablet Take 500 mcg by mouth daily.    [provider]  VITAMIN D  PO Take by mouth.    [provider]    Allergies: Patient has no known allergies.    Review of Systems  Updated Vital Signs BP 123/74   Pulse (!) 55   Temp 98 F (36.7 C) (Oral)   Resp 12   Wt 61.2 kg   SpO2 94%   BMI 24.49 kg/m   Physical Exam Musculoskeletal:     Comments: Thumb spica splint removed.  Postoperative incisions appear clean dry and intact.  No discharge.  No erythema surrounding the incisions or in the hand.  The thenar and hypothenar eminences are soft and compressible as are the compartments of the forearm.  Cap refill less than 2 seconds in all fingers of the hand.  Intact sensation to light touch in all fingers of the hand.  Able to make Davis fist without difficulty but does have some pain with flexion of the thumb     (  all labs ordered are listed, but only abnormal results are displayed) Labs Reviewed - No data to display  EKG: None  Radiology: No results found.   Erika Davis Application  Date/Time: 05/07/2024 7:51 AM  Performed by: Erika Lamar BROCKS, MD Authorized by: Erika Lamar BROCKS, MD   Consent:    Consent obtained:  Verbal   Consent given by:  Patient Pre-procedure details:    Distal neurologic exam:  Normal   Distal perfusion: brisk capillary refill   Procedure details:    Location:  Wrist   Wrist location:  L wrist   Splint type:  Thumb spica   Supplies used: Ortho-Glass. Post-procedure details:    Distal neurologic exam:  Normal   Distal perfusion: unchanged     Procedure completion:  Tolerated well, no immediate  complications    Medications Ordered in the ED  HYDROmorphone  (DILAUDID ) injection 0.5 mg (0.5 mg Intravenous Given 05/06/24 1158)  HYDROmorphone  (DILAUDID ) injection 1 mg (1 mg Intravenous Given 05/06/24 1258)                                    Medical Decision Making Risk Prescription drug management.   Erika Davis is Davis 68 year old female history of left hand surgery where her trapezium was removed presents emergency department postop pain  Initial Ddx:  Postoperative pain, compartment syndrome, arterial injury, nerve injury, infection  MDM/Course:  Patient presents emergency department with pain in her hand after having Davis surgery.  Reports that the nerve block wore off.  Her splint was taken down.  She is neurovascular intact in her hand.  Pain is not in any sort of nerve or dermatomal distribution.  No signs of compartment syndrome.  Hand appears to be vascularly intact.  I do not see any signs of infection or large hematomas.  Suspect that she is having postoperative pain.  Patient is refusing oral pain medication and is requesting IV Dilaudid .  She was given Davis dose and discharged home with instructions to take oxycodone  and follow-up with her hand surgeon tomorrow.    This patient presents to the ED for concern of complaints listed in HPI, this involves an extensive number of treatment options, and is Davis complaint that carries with it Davis high risk of complications and morbidity. Disposition including potential need for admission considered.   Dispo: DC Home. Return precautions discussed including, but not limited to, those listed in the AVS. Allowed pt time to ask questions which were answered fully prior to dc.  Additional history obtained from spouse Records reviewed Outpatient Clinic Notes I have reviewed the patients home medications and made adjustments as needed Social Determinants of health:  Geriatric  Portions of this note were generated with Herbalist. Dictation errors may occur despite best attempts at proofreading.     Final diagnoses:  Post-op pain    ED Discharge Orders          Ordered    oxyCODONE  (ROXICODONE ) 5 MG immediate release tablet  Every 4 hours PRN        05/06/24 1452               Erika Lamar BROCKS, MD 05/07/24 [phone]

## 2024-05-06 NOTE — ED Notes (Signed)
 Took patient off of O2 to monitor sats on room air.

## 2024-05-06 NOTE — Discharge Instructions (Signed)
 You were seen for your postoperative pain in the emergency department.   At home, please take Tylenol  and elevate your hand to limit the pain and swelling. You may also take the oxycodone  we have prescribed you for any breakthrough pain that may have.  Do not take this before driving or operating heavy machinery.  Do not take this medication with alcohol or with the Norco you were prescribed  Check your MyChart online for the results of any tests that had not resulted by the time you left the emergency department.   Follow-up with hand surgeon tomorrow.  Return immediately to the emergency department if you experience any of the following: Worsening pain, or any other concerning symptoms.    Thank you for visiting our Emergency Department. It was a pleasure taking care of you today.

## 2024-09-01 ENCOUNTER — Other Ambulatory Visit
